# Patient Record
Sex: Male | Born: 1935 | Race: White | Hispanic: No | Marital: Married | Smoking: Former smoker
Health system: Southern US, Community
[De-identification: ages and names within clinical notes are randomized; demographics above are authoritative.]

## PROBLEM LIST (undated history)

## (undated) DIAGNOSIS — I509 Heart failure, unspecified: Secondary | ICD-10-CM

## (undated) DIAGNOSIS — I10 Essential (primary) hypertension: Secondary | ICD-10-CM

## (undated) DIAGNOSIS — I4891 Unspecified atrial fibrillation: Secondary | ICD-10-CM

## (undated) DIAGNOSIS — E785 Hyperlipidemia, unspecified: Secondary | ICD-10-CM

## (undated) DIAGNOSIS — F0280 Dementia in other diseases classified elsewhere without behavioral disturbance: Secondary | ICD-10-CM

## (undated) DIAGNOSIS — F319 Bipolar disorder, unspecified: Secondary | ICD-10-CM

## (undated) DIAGNOSIS — I739 Peripheral vascular disease, unspecified: Secondary | ICD-10-CM

## (undated) DIAGNOSIS — L97909 Non-pressure chronic ulcer of unspecified part of unspecified lower leg with unspecified severity: Secondary | ICD-10-CM

## (undated) DIAGNOSIS — I82409 Acute embolism and thrombosis of unspecified deep veins of unspecified lower extremity: Secondary | ICD-10-CM

## (undated) DIAGNOSIS — G309 Alzheimer's disease, unspecified: Secondary | ICD-10-CM

## (undated) DIAGNOSIS — I83009 Varicose veins of unspecified lower extremity with ulcer of unspecified site: Secondary | ICD-10-CM

## (undated) DIAGNOSIS — M1990 Unspecified osteoarthritis, unspecified site: Secondary | ICD-10-CM

## (undated) DIAGNOSIS — I639 Cerebral infarction, unspecified: Secondary | ICD-10-CM

---

## 1999-01-22 ENCOUNTER — Encounter (INDEPENDENT_AMBULATORY_CARE_PROVIDER_SITE_OTHER): Payer: Self-pay

## 1999-01-22 ENCOUNTER — Other Ambulatory Visit: Admission: RE | Admit: 1999-01-22 | Discharge: 1999-01-22 | Payer: Self-pay | Admitting: Gastroenterology

## 2002-02-18 DIAGNOSIS — I639 Cerebral infarction, unspecified: Secondary | ICD-10-CM

## 2002-02-18 HISTORY — DX: Cerebral infarction, unspecified: I63.9

## 2002-08-11 ENCOUNTER — Inpatient Hospital Stay (HOSPITAL_COMMUNITY): Admission: EM | Admit: 2002-08-11 | Discharge: 2002-08-16 | Payer: Self-pay | Admitting: Emergency Medicine

## 2002-08-11 ENCOUNTER — Encounter: Payer: Self-pay | Admitting: Emergency Medicine

## 2002-08-11 ENCOUNTER — Encounter: Payer: Self-pay | Admitting: Neurology

## 2002-08-12 ENCOUNTER — Encounter: Payer: Self-pay | Admitting: Cardiovascular Disease

## 2002-08-12 ENCOUNTER — Encounter: Payer: Self-pay | Admitting: Neurology

## 2003-05-31 ENCOUNTER — Emergency Department (HOSPITAL_COMMUNITY): Admission: EM | Admit: 2003-05-31 | Discharge: 2003-05-31 | Payer: Self-pay | Admitting: Emergency Medicine

## 2003-06-13 ENCOUNTER — Ambulatory Visit (HOSPITAL_COMMUNITY): Admission: RE | Admit: 2003-06-13 | Discharge: 2003-06-13 | Payer: Self-pay | Admitting: Internal Medicine

## 2003-12-27 ENCOUNTER — Ambulatory Visit: Payer: Self-pay | Admitting: Cardiology

## 2003-12-30 ENCOUNTER — Ambulatory Visit: Payer: Self-pay | Admitting: Internal Medicine

## 2004-01-31 ENCOUNTER — Ambulatory Visit: Payer: Self-pay | Admitting: Internal Medicine

## 2004-03-06 ENCOUNTER — Ambulatory Visit: Payer: Self-pay | Admitting: Internal Medicine

## 2004-03-21 ENCOUNTER — Ambulatory Visit: Payer: Self-pay | Admitting: Internal Medicine

## 2004-04-13 ENCOUNTER — Ambulatory Visit: Payer: Self-pay | Admitting: Internal Medicine

## 2004-05-14 ENCOUNTER — Ambulatory Visit: Payer: Self-pay | Admitting: Internal Medicine

## 2004-06-13 ENCOUNTER — Ambulatory Visit: Payer: Self-pay | Admitting: Internal Medicine

## 2004-07-26 ENCOUNTER — Ambulatory Visit: Payer: Self-pay | Admitting: Internal Medicine

## 2004-08-23 ENCOUNTER — Ambulatory Visit: Payer: Self-pay | Admitting: Internal Medicine

## 2004-10-01 ENCOUNTER — Ambulatory Visit: Payer: Self-pay | Admitting: Internal Medicine

## 2004-11-05 ENCOUNTER — Ambulatory Visit: Payer: Self-pay | Admitting: Internal Medicine

## 2004-12-03 ENCOUNTER — Ambulatory Visit: Payer: Self-pay | Admitting: Internal Medicine

## 2004-12-31 ENCOUNTER — Ambulatory Visit: Payer: Self-pay | Admitting: Internal Medicine

## 2005-01-18 ENCOUNTER — Ambulatory Visit: Payer: Self-pay | Admitting: Internal Medicine

## 2005-03-05 ENCOUNTER — Ambulatory Visit: Payer: Self-pay | Admitting: Internal Medicine

## 2005-04-12 ENCOUNTER — Ambulatory Visit: Payer: Self-pay | Admitting: Internal Medicine

## 2005-05-10 ENCOUNTER — Ambulatory Visit: Payer: Self-pay | Admitting: Internal Medicine

## 2005-05-29 ENCOUNTER — Ambulatory Visit: Payer: Self-pay | Admitting: Internal Medicine

## 2005-06-27 ENCOUNTER — Ambulatory Visit: Payer: Self-pay | Admitting: Internal Medicine

## 2005-07-05 ENCOUNTER — Ambulatory Visit: Payer: Self-pay | Admitting: Internal Medicine

## 2005-08-20 ENCOUNTER — Ambulatory Visit: Payer: Self-pay | Admitting: Internal Medicine

## 2005-10-09 ENCOUNTER — Ambulatory Visit: Payer: Self-pay | Admitting: Internal Medicine

## 2005-11-11 ENCOUNTER — Ambulatory Visit: Payer: Self-pay | Admitting: Internal Medicine

## 2005-11-12 ENCOUNTER — Ambulatory Visit: Payer: Self-pay | Admitting: Internal Medicine

## 2005-12-11 ENCOUNTER — Ambulatory Visit: Payer: Self-pay | Admitting: Internal Medicine

## 2005-12-11 LAB — CONVERTED CEMR LAB
INR: 6 (ref 0.9–2.0)
Prothrombin Time: 32.1 s (ref 10.0–14.0)

## 2006-01-14 ENCOUNTER — Ambulatory Visit: Payer: Self-pay | Admitting: Internal Medicine

## 2006-02-19 ENCOUNTER — Ambulatory Visit: Payer: Self-pay | Admitting: Internal Medicine

## 2006-03-18 ENCOUNTER — Ambulatory Visit: Payer: Self-pay | Admitting: Internal Medicine

## 2006-03-29 ENCOUNTER — Encounter: Payer: Self-pay | Admitting: Internal Medicine

## 2006-04-15 ENCOUNTER — Ambulatory Visit: Payer: Self-pay | Admitting: Internal Medicine

## 2006-04-15 LAB — CONVERTED CEMR LAB
ALT: 23 units/L (ref 0–40)
AST: 21 units/L (ref 0–37)
BUN: 15 mg/dL (ref 6–23)
Basophils Absolute: 0 10*3/uL (ref 0.0–0.1)
Basophils Relative: 0.3 % (ref 0.0–1.0)
CO2: 35 meq/L — ABNORMAL HIGH (ref 19–32)
Calcium: 9.2 mg/dL (ref 8.4–10.5)
Chloride: 94 meq/L — ABNORMAL LOW (ref 96–112)
Creatinine, Ser: 1.1 mg/dL (ref 0.4–1.5)
Eosinophils Absolute: 0.3 10*3/uL (ref 0.0–0.6)
Eosinophils Relative: 4 % (ref 0.0–5.0)
GFR calc Af Amer: 85 mL/min
GFR calc non Af Amer: 70 mL/min
Glucose, Bld: 153 mg/dL — ABNORMAL HIGH (ref 70–99)
HCT: 50.5 % (ref 39.0–52.0)
Hemoglobin: 17.2 g/dL — ABNORMAL HIGH (ref 13.0–17.0)
Hgb A1c MFr Bld: 8.2 % — ABNORMAL HIGH (ref 4.6–6.0)
INR: 2.2 — ABNORMAL HIGH (ref 0.9–2.0)
Lymphocytes Relative: 31 % (ref 12.0–46.0)
MCHC: 34 g/dL (ref 30.0–36.0)
MCV: 90.6 fL (ref 78.0–100.0)
Monocytes Absolute: 0.7 10*3/uL (ref 0.2–0.7)
Monocytes Relative: 8.1 % (ref 3.0–11.0)
Neutro Abs: 4.8 10*3/uL (ref 1.4–7.7)
Neutrophils Relative %: 56.6 % (ref 43.0–77.0)
Platelets: 188 10*3/uL (ref 150–400)
Potassium: 4 meq/L (ref 3.5–5.1)
Prothrombin Time: 18.8 s — ABNORMAL HIGH (ref 10.0–14.0)
RBC: 5.57 M/uL (ref 4.22–5.81)
RDW: 13.9 % (ref 11.5–14.6)
Sodium: 138 meq/L (ref 135–145)
TSH: 4.79 microintl units/mL (ref 0.35–5.50)
WBC: 8.4 10*3/uL (ref 4.5–10.5)

## 2006-04-18 ENCOUNTER — Ambulatory Visit: Payer: Self-pay | Admitting: Vascular Surgery

## 2006-06-02 ENCOUNTER — Emergency Department (HOSPITAL_COMMUNITY): Admission: EM | Admit: 2006-06-02 | Discharge: 2006-06-02 | Payer: Self-pay | Admitting: Emergency Medicine

## 2006-06-05 ENCOUNTER — Ambulatory Visit: Payer: Self-pay | Admitting: Internal Medicine

## 2006-07-21 ENCOUNTER — Ambulatory Visit: Payer: Self-pay | Admitting: Internal Medicine

## 2006-07-21 LAB — CONVERTED CEMR LAB
BUN: 11 mg/dL (ref 6–23)
Basophils Absolute: 0 10*3/uL (ref 0.0–0.1)
Basophils Relative: 0.4 % (ref 0.0–1.0)
CO2: 32 meq/L (ref 19–32)
Calcium: 9.6 mg/dL (ref 8.4–10.5)
Chloride: 101 meq/L (ref 96–112)
Cholesterol: 151 mg/dL (ref 0–200)
Creatinine, Ser: 0.9 mg/dL (ref 0.4–1.5)
Eosinophils Absolute: 0.4 10*3/uL (ref 0.0–0.6)
Eosinophils Relative: 4.9 % (ref 0.0–5.0)
GFR calc Af Amer: 107 mL/min
GFR calc non Af Amer: 88 mL/min
Glucose, Bld: 145 mg/dL — ABNORMAL HIGH (ref 70–99)
HCT: 45.3 % (ref 39.0–52.0)
HDL: 34.2 mg/dL — ABNORMAL LOW (ref >39.0)
Hemoglobin: 15.3 g/dL (ref 13.0–17.0)
Hgb A1c MFr Bld: 7.3 % — ABNORMAL HIGH (ref 4.6–6.0)
INR: 2 (ref 0.9–2.0)
LDL Cholesterol: 84 mg/dL (ref 0–99)
Lymphocytes Relative: 31.7 % (ref 12.0–46.0)
MCHC: 33.8 g/dL (ref 30.0–36.0)
MCV: 93.3 fL (ref 78.0–100.0)
Monocytes Absolute: 0.6 10*3/uL (ref 0.2–0.7)
Monocytes Relative: 7.9 % (ref 3.0–11.0)
Neutro Abs: 4.2 10*3/uL (ref 1.4–7.7)
Neutrophils Relative %: 55.1 % (ref 43.0–77.0)
Platelets: 221 10*3/uL (ref 150–400)
Potassium: 4.5 meq/L (ref 3.5–5.1)
Prothrombin Time: 17.5 s — ABNORMAL HIGH (ref 10.0–14.0)
RBC: 4.85 M/uL (ref 4.22–5.81)
RDW: 14.5 % (ref 11.5–14.6)
Sodium: 141 meq/L (ref 135–145)
Total CHOL/HDL Ratio: 4.4
Triglycerides: 164 mg/dL — ABNORMAL HIGH (ref 0–149)
VLDL: 33 mg/dL (ref 0–40)
WBC: 7.6 10*3/uL (ref 4.5–10.5)
aPTT: 27.8 s (ref 26.5–36.5)

## 2006-09-25 ENCOUNTER — Ambulatory Visit: Payer: Self-pay | Admitting: Cardiology

## 2006-09-26 ENCOUNTER — Encounter (INDEPENDENT_AMBULATORY_CARE_PROVIDER_SITE_OTHER): Payer: Self-pay | Admitting: Emergency Medicine

## 2006-09-26 ENCOUNTER — Inpatient Hospital Stay (HOSPITAL_COMMUNITY): Admission: EM | Admit: 2006-09-26 | Discharge: 2006-10-02 | Payer: Self-pay | Admitting: Emergency Medicine

## 2006-09-26 ENCOUNTER — Ambulatory Visit: Payer: Self-pay | Admitting: Vascular Surgery

## 2006-09-30 ENCOUNTER — Ambulatory Visit: Payer: Self-pay | Admitting: Physical Medicine & Rehabilitation

## 2006-11-10 ENCOUNTER — Inpatient Hospital Stay (HOSPITAL_COMMUNITY): Admission: EM | Admit: 2006-11-10 | Discharge: 2006-11-18 | Payer: Self-pay | Admitting: Emergency Medicine

## 2006-11-11 ENCOUNTER — Encounter (INDEPENDENT_AMBULATORY_CARE_PROVIDER_SITE_OTHER): Payer: Self-pay | Admitting: Internal Medicine

## 2006-11-27 ENCOUNTER — Encounter: Payer: Self-pay | Admitting: Internal Medicine

## 2006-12-02 ENCOUNTER — Observation Stay (HOSPITAL_COMMUNITY): Admission: EM | Admit: 2006-12-02 | Discharge: 2006-12-09 | Payer: Self-pay | Admitting: Emergency Medicine

## 2006-12-05 ENCOUNTER — Ambulatory Visit: Payer: Self-pay | Admitting: Internal Medicine

## 2006-12-07 ENCOUNTER — Ambulatory Visit: Payer: Self-pay | Admitting: Internal Medicine

## 2006-12-17 ENCOUNTER — Ambulatory Visit: Payer: Self-pay | Admitting: Cardiology

## 2006-12-17 LAB — CONVERTED CEMR LAB
CO2: 33 meq/L — ABNORMAL HIGH (ref 19–32)
Calcium: 8.9 mg/dL (ref 8.4–10.5)
Chloride: 100 meq/L (ref 96–112)
Creatinine, Ser: 1.1 mg/dL (ref 0.4–1.5)
GFR calc non Af Amer: 70 mL/min
Glucose, Bld: 138 mg/dL — ABNORMAL HIGH (ref 70–99)
Potassium: 4.4 meq/L (ref 3.5–5.1)
Sodium: 140 meq/L (ref 135–145)

## 2007-02-17 ENCOUNTER — Inpatient Hospital Stay (HOSPITAL_COMMUNITY): Admission: EM | Admit: 2007-02-17 | Discharge: 2007-02-20 | Payer: Self-pay | Admitting: Emergency Medicine

## 2007-03-13 ENCOUNTER — Encounter: Payer: Self-pay | Admitting: Pulmonary Disease

## 2007-03-13 ENCOUNTER — Ambulatory Visit: Payer: Self-pay | Admitting: Internal Medicine

## 2007-03-17 ENCOUNTER — Emergency Department (HOSPITAL_COMMUNITY): Admission: EM | Admit: 2007-03-17 | Discharge: 2007-03-17 | Payer: Self-pay | Admitting: Emergency Medicine

## 2007-03-27 ENCOUNTER — Ambulatory Visit: Payer: Self-pay | Admitting: Pulmonary Disease

## 2007-03-27 DIAGNOSIS — I10 Essential (primary) hypertension: Secondary | ICD-10-CM | POA: Insufficient documentation

## 2007-03-27 DIAGNOSIS — G4733 Obstructive sleep apnea (adult) (pediatric): Secondary | ICD-10-CM | POA: Insufficient documentation

## 2007-03-27 DIAGNOSIS — E119 Type 2 diabetes mellitus without complications: Secondary | ICD-10-CM | POA: Insufficient documentation

## 2007-03-31 ENCOUNTER — Encounter: Payer: Self-pay | Admitting: Pulmonary Disease

## 2007-03-31 ENCOUNTER — Ambulatory Visit (HOSPITAL_BASED_OUTPATIENT_CLINIC_OR_DEPARTMENT_OTHER): Admission: RE | Admit: 2007-03-31 | Discharge: 2007-03-31 | Payer: Self-pay | Admitting: Pulmonary Disease

## 2007-04-15 ENCOUNTER — Ambulatory Visit: Payer: Self-pay | Admitting: Pulmonary Disease

## 2007-04-17 ENCOUNTER — Telehealth (INDEPENDENT_AMBULATORY_CARE_PROVIDER_SITE_OTHER): Payer: Self-pay | Admitting: *Deleted

## 2007-05-01 ENCOUNTER — Ambulatory Visit: Payer: Self-pay | Admitting: Pulmonary Disease

## 2007-05-26 ENCOUNTER — Encounter: Payer: Self-pay | Admitting: Pulmonary Disease

## 2007-06-01 ENCOUNTER — Encounter: Payer: Self-pay | Admitting: Pulmonary Disease

## 2008-06-02 IMAGING — CR DG CHEST 1V PORT
1 series · 1 of 1 positions shown · non-contrast
Comparison: none

CLINICAL DATA: 73-year-old male with respiratory distress.  Shortness of breath.  
 PORTABLE CHEST - 11/10/2006:

[view not recorded]
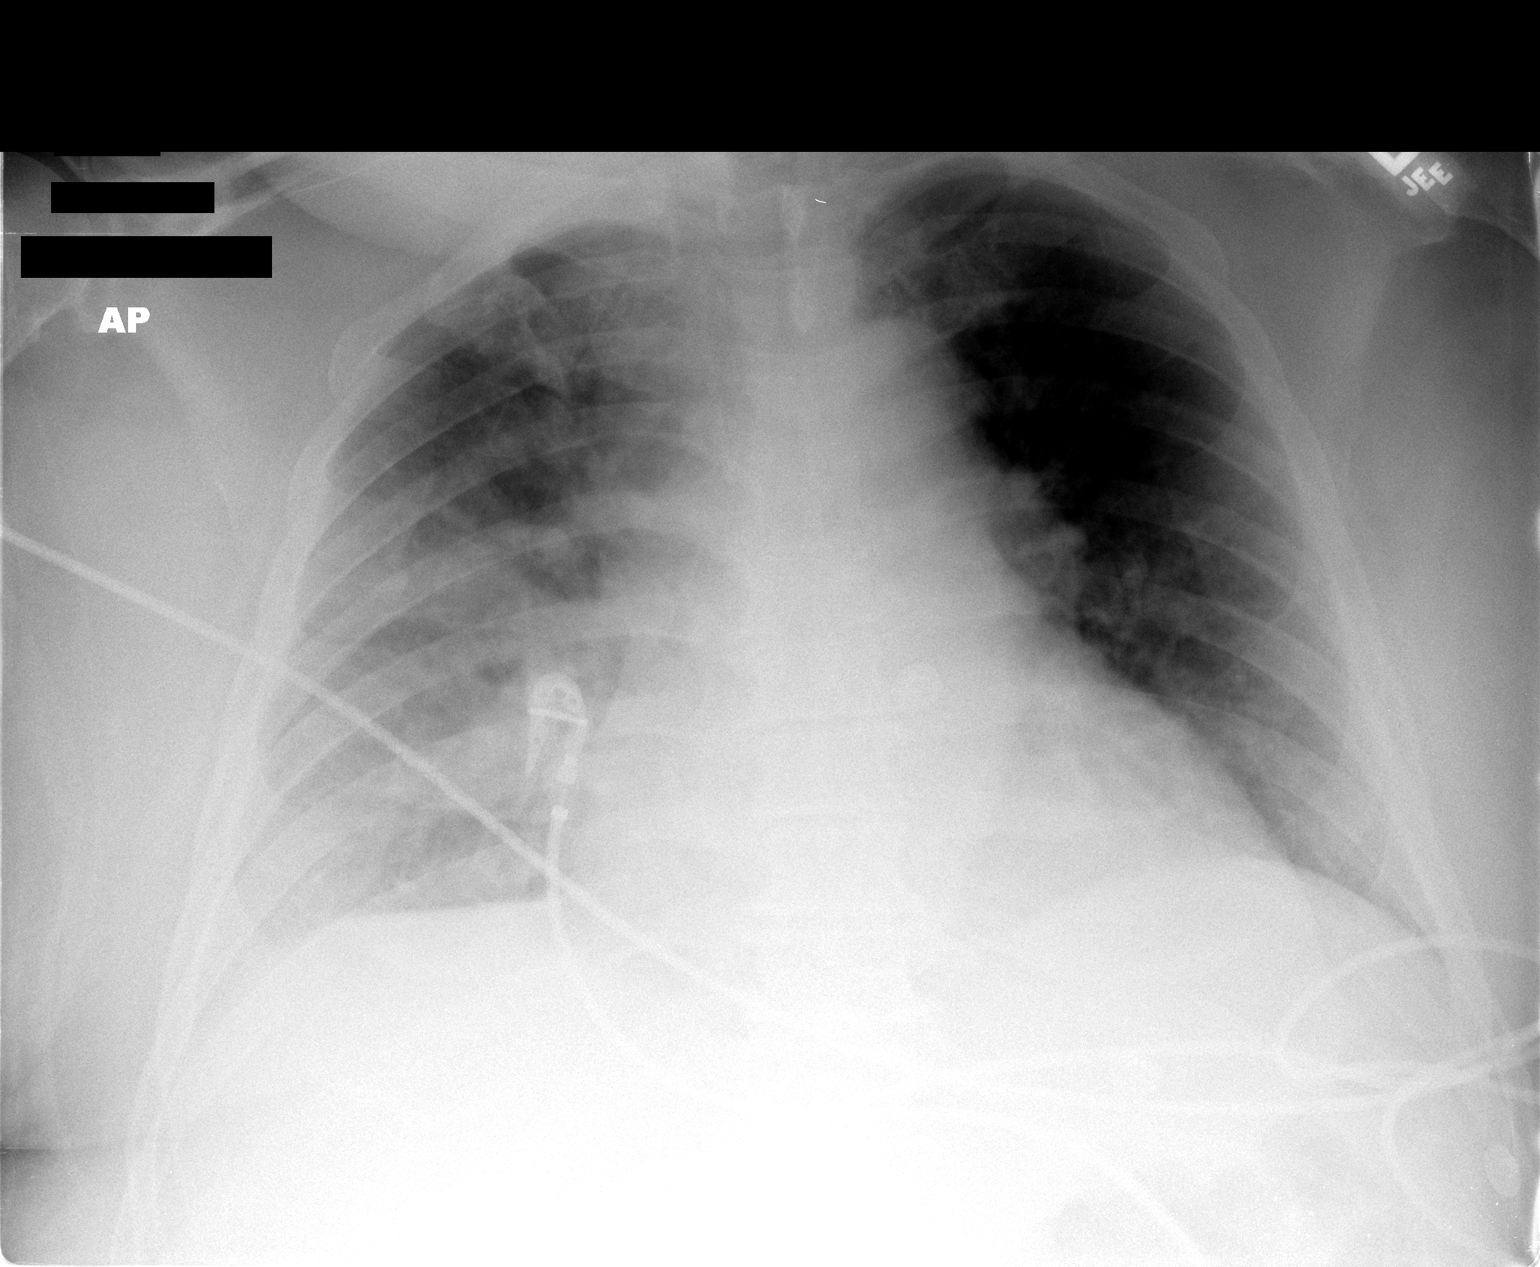

[1 of 1 positions shown; findings below may reference images not displayed]

FINDINGS: Cardiopericardial silhouette is mildly enlarged.  Lung volumes are decreased.  Airspace disease is asymmetric on the right.  Mild edema is now present.
IMPRESSION: Cardiomegaly and asymmetric airspace disease.  This may represent asymmetric edema and failure versus early infection.

## 2008-06-03 IMAGING — CR DG CHEST 1V PORT
1 series · 1 of 1 positions shown · non-contrast
Comparison: 11/10/06.

CLINICAL DATA: Pulmonary edema.  Hypertensive crisis. Ventricular tachycardia.
 PORTABLE CHEST - 1 VIEW ? 11/11/06 AT 3433 HOURS:

[view not recorded]
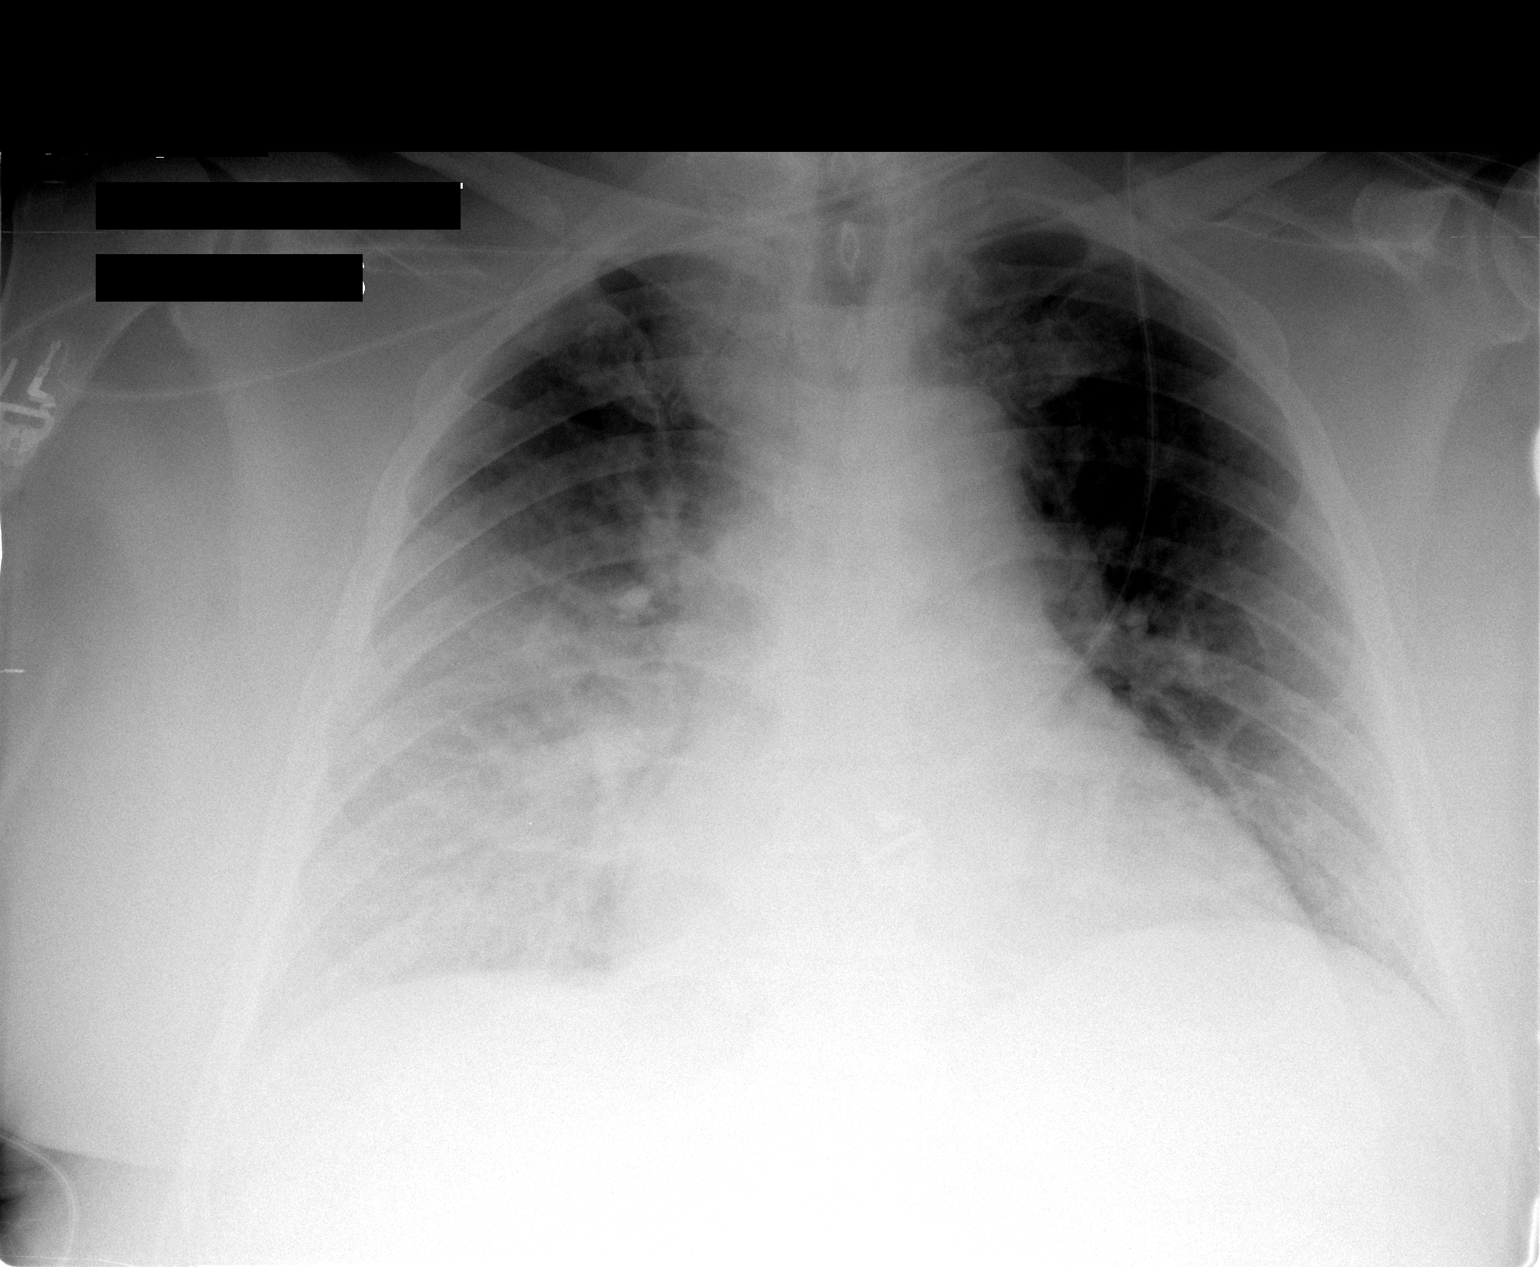

[1 of 1 positions shown; findings below may reference images not displayed]

FINDINGS: There is cardiomegaly. Again seen is asymmetrical airspace disease involving the right lung to a greater extent. This may be secondary to pneumonia or asymmetrical pulmonary edema. The aeration of the lungs is not significantly changed.
IMPRESSION: Cardiomegaly with asymmetrical airspace disease (right greater than left). No significant change in aeration.

## 2008-06-24 IMAGING — CR DG CHEST 2V
1 series · 1 of 1 positions shown · non-contrast
Comparison: 11/16/2006

CLINICAL DATA: Patient is unable to stand.  CHF.
 CHEST - 2 VIEW:

[view not recorded]
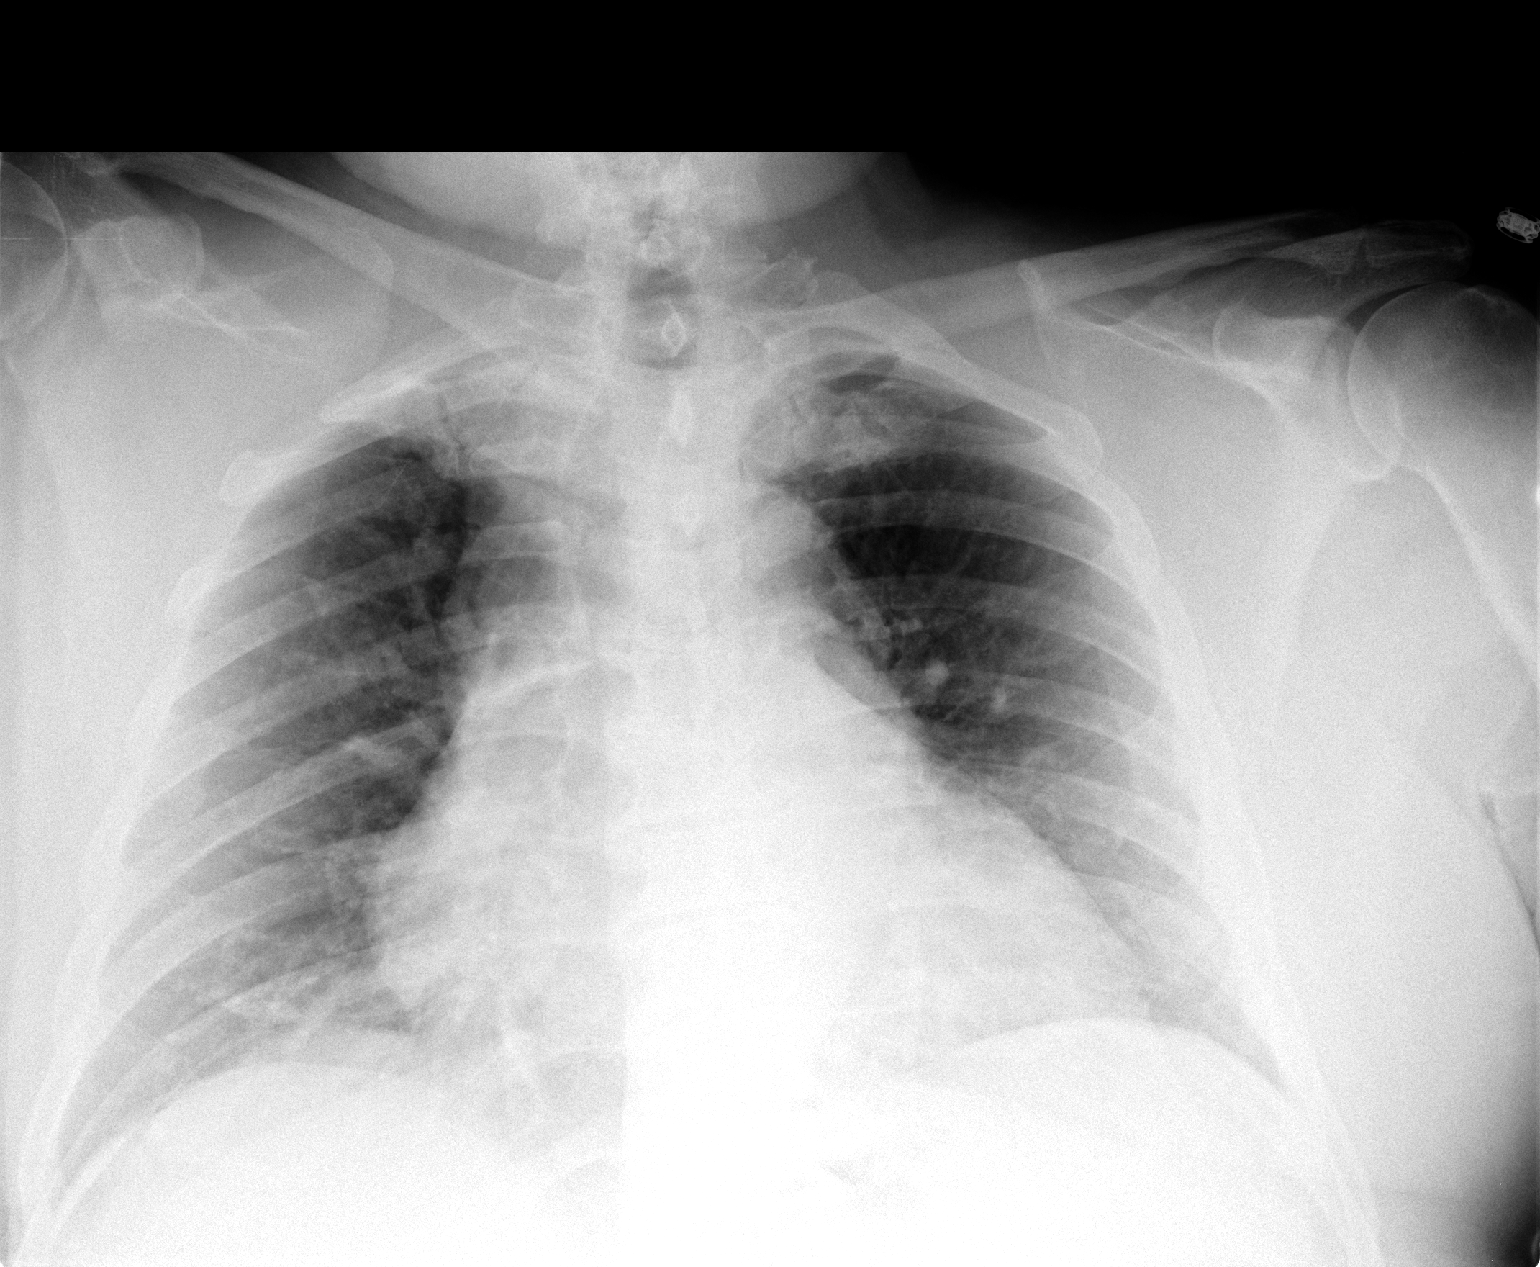

[1 of 1 positions shown; findings below may reference images not displayed]

FINDINGS: The heart is moderately enlarged.  Pulmonary vascularity is within normal limits.  Lungs are clear and under inflated.   No pneumothoraces or effusions are seen.
IMPRESSION: Cardiomegaly without CHF.

## 2008-06-24 IMAGING — CT CT HEAD W/O CM
1 series · 15 of 30 positions shown, 19 images · IV contrast (agent unspecified)
Comparison: MRI brain 06/13/03

CLINICAL DATA: 74-year-old male with weakness, headache, and history of stroke, hypertension and diabetes.  
 HEAD CT WITHOUT CONTRAST:
TECHNIQUE: Contiguous axial images were obtained from the base of the skull through the vertex according to standard protocol without contrast.

[Series 2: head routine 4.8 h47s · axial · 0.46mm/px · z∈[-125,+5]mm · 15 of 30 slices shown, 19 images]
[im 2/30  brain]
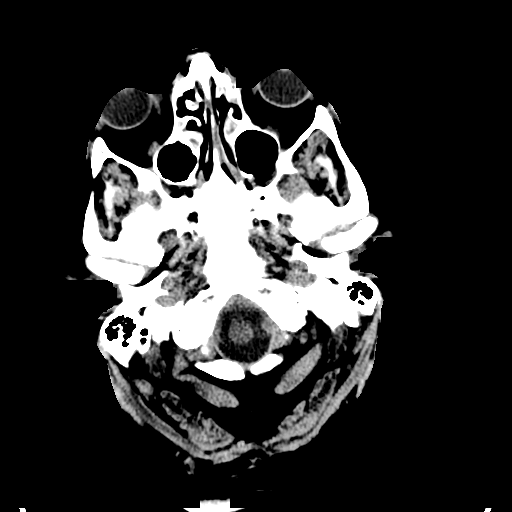
[im 2/30  bone]
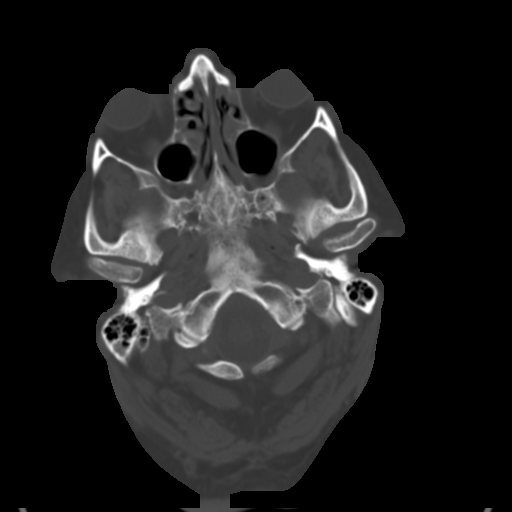
[im 4/30  brain]
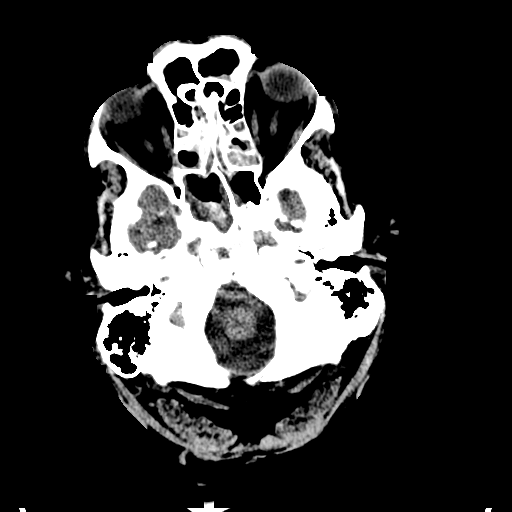
[im 6/30  brain]
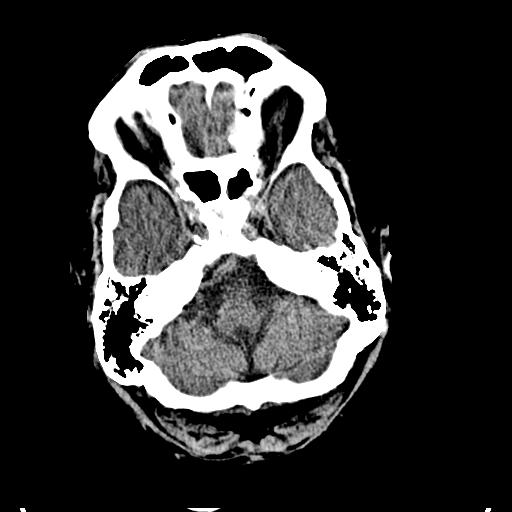
[im 8/30  brain]
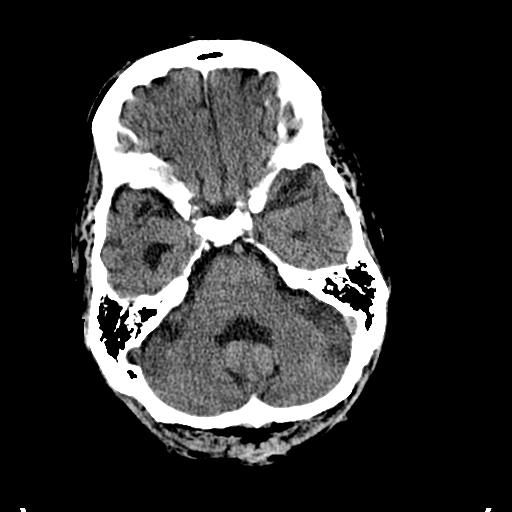
[im 10/30  brain]
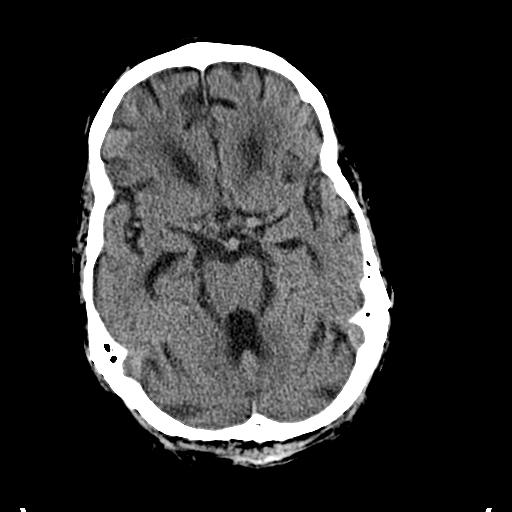
[im 10/30  bone]
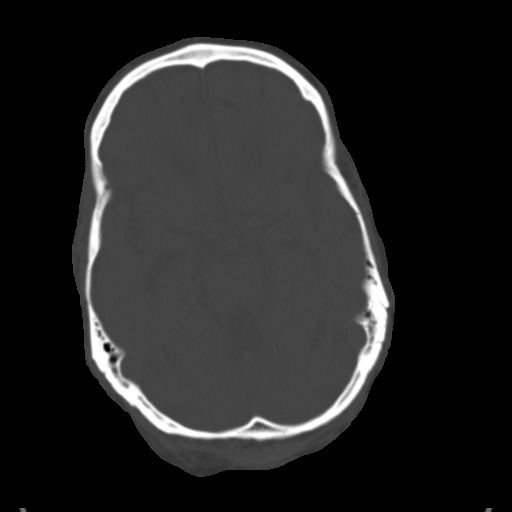
[im 12/30  brain]
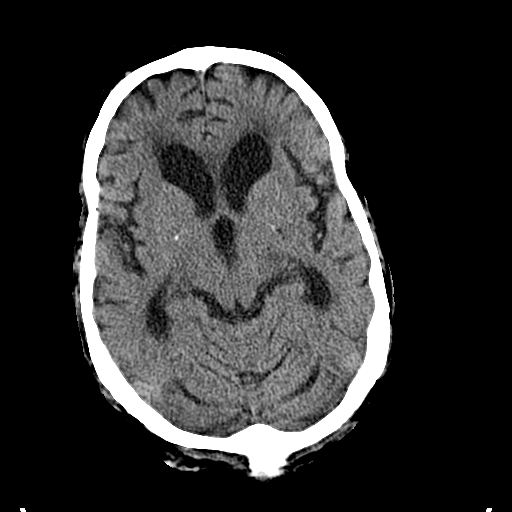
[im 14/30  brain]
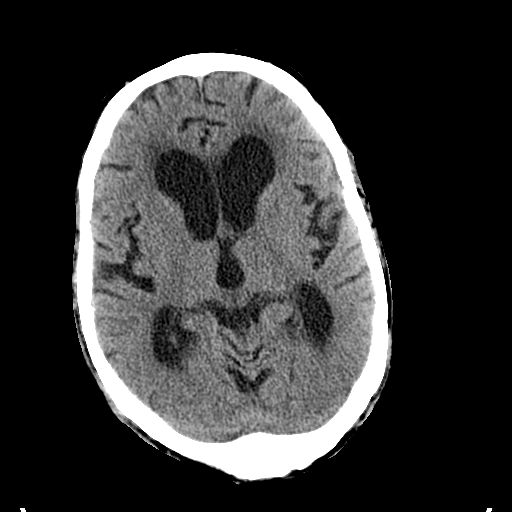
[im 16/30  brain]
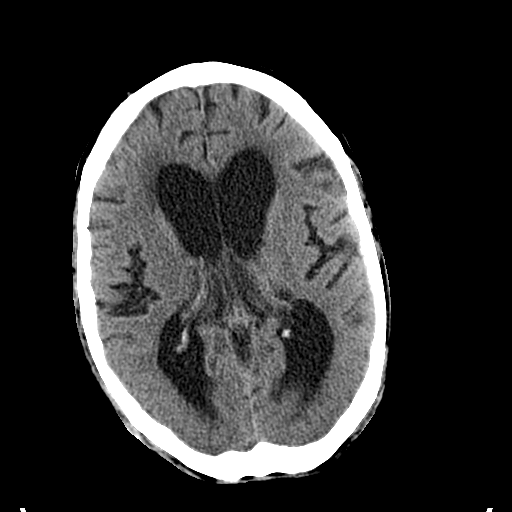
[im 17/30  brain]
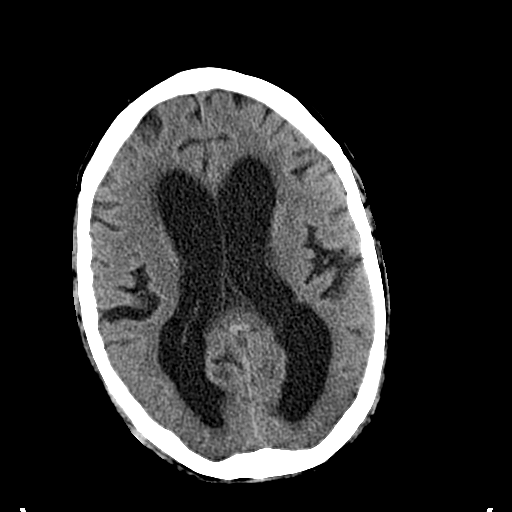
[im 17/30  bone]
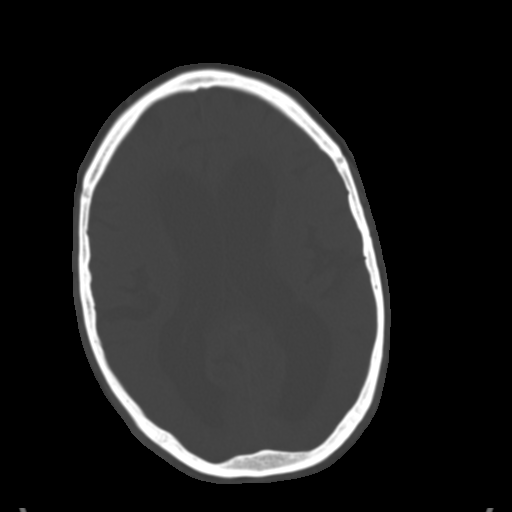
[im 19/30  brain]
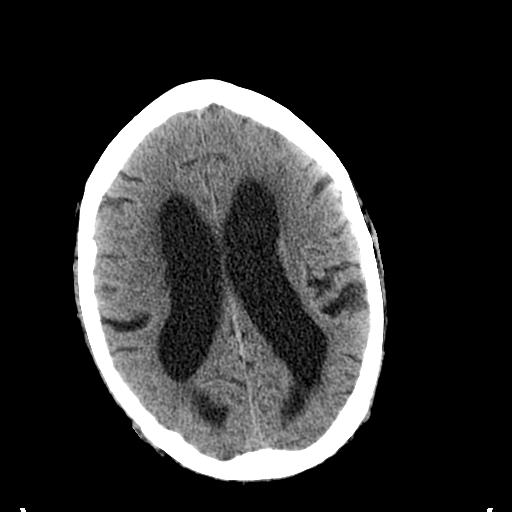
[im 21/30  brain]
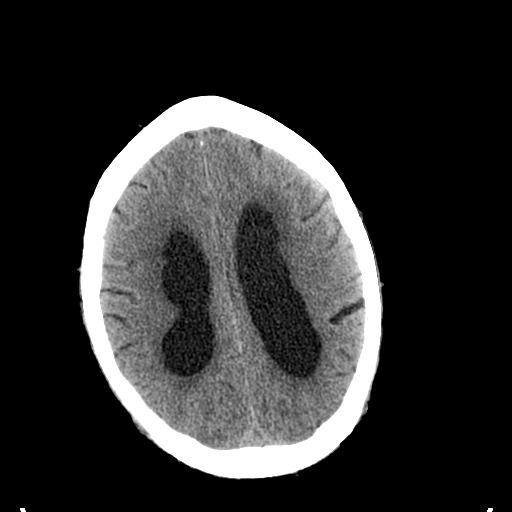
[im 23/30  brain]
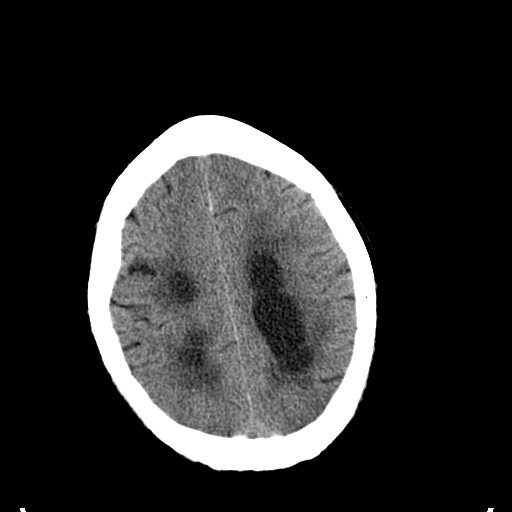
[im 25/30  brain]
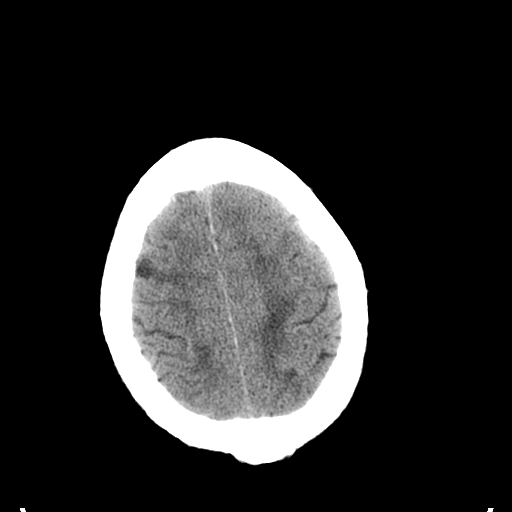
[im 25/30  bone]
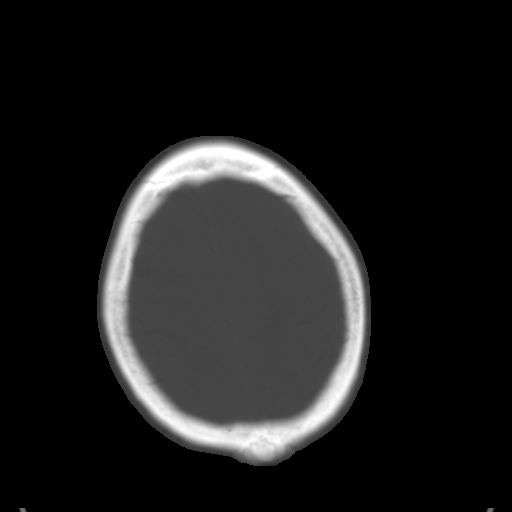
[im 27/30  brain]
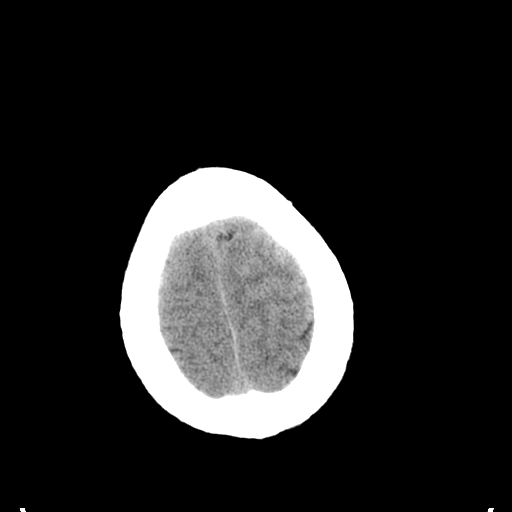
[im 29/30  brain]
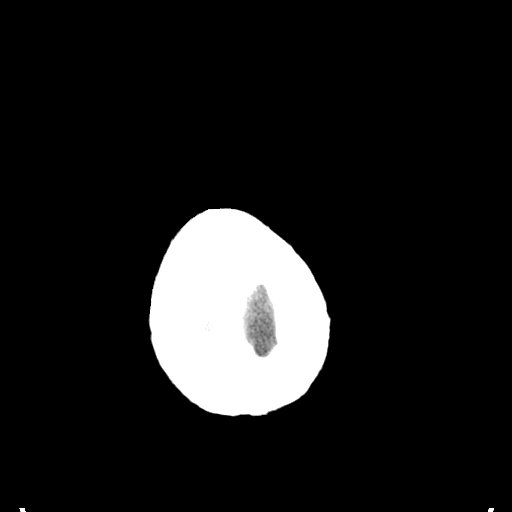

[15 of 30 positions shown; findings below may reference images not displayed]

FINDINGS: There is ventriculomegaly which is diffuse and stable from the prior exam.  Given the appearance, normal pressure hydrocephalus cannot be excluded.  There is no midline shift, mass effect, or intracranial hemorrhage identified.  Small basal ganglia dystrophic calcifications are noted.  No evidence of acute cortically based infarct.  Right frontal lobe superior encephalomalacia is stable compatible with prior infarct.  The visualized orbits are normal.  Scalp soft tissues are within normal limits.  There is new opacification of the ethmoid and sphenoid sinuses bilaterally.  The frontal sinuses and visualized maxillary sinuses are spared.  Mastoid air cells remain clear.  Atherosclerotic calcification of the right carotid siphon is noted.  Incidental nonfusion of the posterior C1 arch is seen.  No acute osseous abnormality is seen.
IMPRESSION: 1.  Stable appearance of the brain, no acute intracranial abnormality.
 2.  Diffuse ventriculomegaly is stable, but given the appearance, clinical correlation for the possibility of normal pressure hydrocephalus is recommended.
 3.  Ethmoid and sphenoid sinus disease is new.

## 2010-03-22 NOTE — Consult Note (Signed)
Summary: [facility]  [facility]   Imported By: Sherian Rein 08/07/2009 15:17:25  _____________________________________________________________________  External Attachment:    Type:   Image     Comment:   External Document

## 2010-07-03 NOTE — Discharge Summary (Signed)
NAMELAWTON, DOLLINGER               ACCOUNT NO.:  [id number]   MEDICAL RECORD NO.:  [id number]          PATIENT TYPE:  INP   LOCATION:  4708                         FACILITY:  [facility]   PHYSICIAN:  Mobolaji B. Bakare, M.D.DATE OF BIRTH:  1932/05/31   DATE OF ADMISSION:  09/25/2006  DATE OF DISCHARGE:  10/02/2006                               DISCHARGE SUMMARY   ADDENDUM:  This is an addendum to an earlier discharge summary done by  Dr. Oneita Hurt.   PRIMARY CARE PHYSICIAN:  Dr. Redmond School   The patient was scheduled for discharge on September 30, 2006, but family  requested short-term nursing facility placement because they could not  provide care at home, although home health services were arranged at  that time.  The patient is now accepted for short-term rehabilitation at  [facility] [place].   FINAL DISCHARGE MEDICATIONS:  1. Fosinopril 20 mg daily.  2. Lasix 40 mg daily.  3. Crestor 10 mg 1/2 tab to be taken on Monday, Wednesday, and Friday.  4. Coumadin 7.5 mg daily.  Further adjustments to be made to Coumadin      dosing per INR.  5. Depakote ER 500 mg daily.  6. Lantus 20 units subcu q.h.s.  7. Glipizide 5 mg q.h.s.  8. Citalopram 20 mg q.h.s.  9. Xanax 0.5 mg q.4-6h. p.r.n. anxiety.  10.Hydrocodone 5 mg t.i.d. p.r.n. pain.   The following medications have been discontinued:  1. Metformin 1000 mg b.i.d.  2. Toprol XL 100 mg daily.  3. Taztia XT 180 mg daily.   WOUND CARE:  1. Apply Mepilex border to right lower extremity and around posterior      wound.  Change every Monday and Friday.  2. Right toe, Mepilex light, apply to right lower extremity and change      on Monday, Wednesday, and Friday.   DISPOSITION:  To skilled nursing facility for short-term stay and  rehabilitation.   HOSPITAL COURSE:  This is an addendum to previous hospital course as  dictated by Dr. Oneita Hurt.  1. Diabetes mellitus.  The patient's hemoglobin A1c was 8.2 on      arrival.  He  was started on low dose Lantus.  Metformin was held.      Glipizide was discontinued.  Despite reducing the home dose of      hypoglycemic agents, his blood glucose ranged between 160-220.      Hence, he will be discharged home on Lantus 20 units subcu daily,      glipizide; metformin is on hold.  The patient's daughter stated      that he is not compliant with the metformin in the morning, but she      gives him all his other medications at night.  His compliance to      medication, again, is questionable.  2. Hypertension.  The patient is noted to have been on 3 blood      pressure medications, i.e., Taztia XT 180 mg daily, Toprol XL 100      mg daily, fosinopril 20 mg daily.  While in the  hospital, his blood      pressure has been well-controlled on fosinopril and Toprol XL.  In      fact, the blood pressure is trending downwards.  At the time of      dictation, blood pressure is 95/54.  Therefore, Toprol XL was also      discontinued.  Further adjustments can be made to blood pressure      medications if blood pressure starts to increase.   CONDITION ON DISCHARGE:  The patient is hemodynamically stable.  Temperature 98, pulse 97, respiratory rate 20, blood pressure 195/54, O2  saturations 95% on room air.  The patient needs to have physical therapy  and occupational therapy in the rehabilitation center in the skilled  nursing facility.   DISCHARGE LABORATORY DATA:  PT 17.4, INR 1.4.   RECOMMENDATIONS:  Check PT/INR in 2-3 days.  Further recommendations for  Coumadin per attending physician at the facility.      Mobolaji B. Corky Downs, M.D.  Electronically Signed     MBB/MEDQ  D:  10/02/2006  T:  10/02/2006  Job:  [id number]   cc:   Dr. Redmond School

## 2010-07-03 NOTE — Discharge Summary (Signed)
[name], [name] NO.:  [id number]   MEDICAL RECORD NO.:  [id number]          PATIENT TYPE:  INP   LOCATION:  5729                         FACILITY:  [facility]   PHYSICIAN:  Isidor Holts, M.D.  DATE OF BIRTH:  Jun 19, 1932   DATE OF ADMISSION:  12/02/2006  DATE OF DISCHARGE:  12/09/2006                               DISCHARGE SUMMARY   ADDENDUM:   PRIMARY CARE PHYSICIAN:  Dr. Livingston Diones at [facility].   For discharge diagnoses, procedures, consultations, and detailed  clinical course, referred to interim discharge summary dated December 08, 2006.  The patient has been seen by urologist on December 08, 2006, i.e.,  Dr. Logan Bores, and opinion is that the patient's prepuce retracts quite  easily without any difficulty and the meatus is normal.  Therefore the  patient has no need for circumcision or dorsal slit.  No further  treatment is recommended.  In addition, the patient was reevaluated by  cardiologist on December 09, 2006.  His Coreg dose has been further  reduced to 6.25 mg p.o. b.i.d. and recommendation is that the patient  will need close follow-up of basic metabolic panel on an outpatient  basis. He has been recommended to avoid ACE inhibitors.  No diuretics  were administered on December 09, 2006.  Diuretics may be resumed in low-  dose, i.e., 40 mg p.o. daily from December 10, 2006.  The patient is  recommended to follow up with Dr. Dietrich Pates, cardiologist, following  discharge.  In addition, it is pertinent to note the creatinine on  December 09, 2006, was 1.62.  As there were no further active issues, the  patient has been discharged as planned on December 09, 2006.      Isidor Holts, M.D.  Electronically Signed     CO/MEDQ  D:  12/09/2006  T:  12/10/2006  Job:  [id number]   cc:   [facility] Dr. Westley Chandler, MD, [facility]  Jonelle Sidle, MD

## 2010-07-03 NOTE — Procedures (Signed)
[name], [name] NO.:  [id number]   MEDICAL RECORD NO.:  [id number]          PATIENT TYPE:  OUT   LOCATION:  SLEEP CENTER                 FACILITY:  [facility]   PHYSICIAN:  Barbaraann Share, MD,FCCPDATE OF BIRTH:  11[birth year]02-[birth year]   DATE OF STUDY:  03/31/2007                            NOCTURNAL POLYSOMNOGRAM   REFERRING PHYSICIAN:  Barbaraann Share, MD,FCCP   REFERRING PHYSICIAN:  Barbaraann Share, MD,FCCP   INDICATION FOR THE STUDY:  Hypersomnia with sleep apnea.   EPWORTH SCORE:  5   SLEEP ARCHITECTURE:  The patient had a total sleep time of 364 minutes  with no slow-wave sleep and only 76 minutes of REM.  Sleep onset latency  was normal at 22 minutes, and REM onset was prolonged at 188 minutes.  Sleep efficiency was decreased at 85%.   RESPIRATORY DATA:  The patient was found to have 38 obstructive apneas,  42 central apneas, and 148 obstructive hypopneas for an apnea-hypopnea  index of 38 events per hour.  The events were not positional, and there  was loud snoring noted throughout.   OXYGEN DATA:  There was severe O2 desaturation as low as 30% during the  night, however, a question was raised whether or not the probe was  functioning properly or in the proper position.  Overall, the patient  spent 73 minutes, less than 88% during the night.   CARDIAC DATA:  The patient was found to have atrial fibrillation with a  controlled ventricular response.   MOVEMENT/PARASOMNIA:  None.   IMPRESSION/RECOMMENDATION:  1. Severe obstructive and central sleep apnea with an apnea-hypopnea      index of 38 events per hour and O2 desaturation possibly as low as      30%.  Treatment for this degree of sleep apnea should focus on      weight loss as      well as CPAP.  2. Atrial fibrillation with controlled ventricular response noted      during this study.      Barbaraann Share, MD,FCCP  Diplomate, American Board of Sleep  Medicine  Electronically Signed     KMC/MEDQ  D:  04/15/2007 07:42:13  T:  04/15/2007 16:18:54  Job:  [id number]

## 2010-07-03 NOTE — Consult Note (Signed)
NAMEAARISH, ROCKERS NO.:  [id number]   MEDICAL RECORD NO.:  [id number]          PATIENT TYPE:  INP   LOCATION:  5729                         FACILITY:  [facility]   PHYSICIAN:  Pricilla Riffle, MD, FACCDATE OF BIRTH:  06/04/1932   DATE OF CONSULTATION:  08/05/2006  DATE OF DISCHARGE:                                 CONSULTATION   IDENTIFICATION:  The patient is a 77 year old who we are asked to see  regarding weakness, CHF.   HISTORY OF PRESENT ILLNESS:  The patient was remotely seen in Cardiology  by Lewayne Bunting back in 2004.  He has a history of CVA, chronic atrial  fibrillation, CHF (mainly right-sided).   He was admitted October 14 with weakness.  Note, he had recently been  discharged after being treated for pneumonia.  On this last discharge,  he was sent to the nursing home on 100 t.i.d. Lasix plus Zaroxolyn.  Since that time, he has been feeling poorly, week, unable to stand up.   On admission, he was found to have a UTI.  CT was negative.  He was felt  to be dehydrated and Lasix was held and he was given IV fluids.   The patient has improved clinically and now denies chest pain.  No  shortness of breath.  No dizziness.   ALLERGIES:  PENICILLIN.   CURRENT MEDICATIONS:  1. Aspirin 81.  2. Coreg 12.5 b.i.d.  3. Keflex 500 b.i.d.  4. Celexa 20 q.h.s.  5. Depakote 500.  6. Enoxaparin 40 daily.  7. Lasix 60 b.i.d. p.o.  8. Glipizide 5 b.i.d.  9. Sliding scale insulin.  10.Lisinopril 5.  11.Protonix 40.  12.Senna q.h.s.  13.Zocor 20.  14.Coumadin q.h.s.   PAST MEDICAL HISTORY:  1. Chronic atrial fibrillation.  2. History of right brain CVA 2004.  3. Hypertension.  4. Hyperlipidemia.  5. Diabetes.  6. Depression.  7. Pneumonia September 2008.  8. Obesity.  9. History of DVT.  10.Venous stasis ulcers.  11.Chronic dyspnea.  12.Chronic diastolic CHF.  13.Normal adenosine Myoview August 2004.   Note echocardiogram on September 23 showed an LVEF  of 60-70%.  The RV  was noted to be mild to moderately dilated as was the RA.  There was  moderate dilatation also of the left atrium.  Moderate TR.  No RVEF was  commented on.   SOCIAL HISTORY:  The patient lives in [facility] nursing facility.  He is  retired.  He does not smoke, does not drink.   FAMILY HISTORY:  Father died of blood clots, otherwise no known cardiac  history.   REVIEW OF SYSTEMS:  All systems reviewed negative to the above problem  except as noted above.   PHYSICAL EXAMINATION:  GENERAL:  Currently the patient denies chest  pain.  No shortness of breath.  VITAL SIGNS:  Blood pressure is 115-121/60-83, his pulse is 78 and  irregular, temperature is 98.0, O2 saturation on 2 liters 94%.  Weight  per report on admission was 104 kg, now 139 (?).  I's and O's on October  15, he was positive 192;  October 16, -2320; October 17, -1150.  HEENT:  Normocephalic, atraumatic.  EOMI.  PERRL.  Missing front four  teeth.  Mucous membranes moist.  NECK:  JVP is normal.  No bruits.  LUNGS:  Relatively clear.  No rales or wheezes.  CARDIAC:  Exam regular rate and rhythm S1-S2.  No S3, no murmurs.  ABDOMEN:  Supple, nontender.  No hepatosplenomegaly.  No masses.  Normal  bowel sounds.  EXTREMITIES:  Good distal pulses 2+.  No lower extremity edema, but  chronic skin changes with previous edema.  NEUROLOGICAL:  Alert and orient x3.  Cranial nerves II-XII grossly  intact.  Moving all extremities.  Normal strength.   STUDIES:  Chest x-ray by report done October 14 shows cardiomegaly  without CHF.  EKG shows atrial fibrillation, 84 beats per minute, right  bundle branch block.   LABORATORY DATA:  Hemoglobin 11.9, WBC of 8, BUN and creatinine of 17  and 1.05, potassium of 3.4.  UA shows moderate leukocyte esterase,  positive nitrate, many bacteria.   IMPRESSION:  The patient is a 77 year old history of atrial  fibrillation, CHF (right-sided) diastolic dysfunction, question sleep   apnea.  Admitted with failure to thrive, dehydration, UTI.  His Lasix  dose has been decreased.  Clinically he is improved.  LV is normal.  RV  is dilated.  On exam presently, volume status looks good.  No evidence  for significant volume overload.   RECOMMENDATIONS:  1. Would continue current regimen.  Make sure that he has an      outpatient followup appointment- Wednesday, 10/29 at 1:30 PM at      [facility] [address]).  2. Replete K.  3. Dyslipidemia.  Continue statin  4. Afib.  Continue rrate control and coumadin.   DICTATION ENDED HERE      Pricilla Riffle, MD, [facility]  Electronically Signed     PVR/MEDQ  D:  12/05/2006  T:  12/08/2006  Job:  [id number]

## 2010-07-03 NOTE — Consult Note (Signed)
NAMEUNIQUE, SEARFOSS NO.:  [id number]   MEDICAL RECORD NO.:  [id number]          PATIENT TYPE:  INP   LOCATION:  2906                         FACILITY:  [facility]   PHYSICIAN:  Jamison Neighbor, M.D.  DATE OF BIRTH:  10-11-1932   DATE OF CONSULTATION:  11/11/2006  DATE OF DISCHARGE:                                 CONSULTATION   CONSULTANT:  Dr. Logan Bores.   REASON FOR CONSULTATION:  Inability to place Foley catheter.   HISTORY:  This 77 year old male was admitted to the hospital service  with respiratory failure.  This was felt to be multifocal diastolic  heart failure, with possible aspiration pneumonia.  Urologic  consultation was sought to place a Foley catheter for monitoring  purposes.   I was able to obtain somewhat of a history from both the patient and his  family.  It does not appear that the patient has had any  previous  history of urologic instrumentation.  He has never required care by  urology.  He has not had any previous TURP or other prostatic urethral  or bladder surgery.  He is taking no medication for bladder outlet  symptoms.  He has, however, had difficulty with Foley catheter placement  during previous hospitalization, and it was also difficult to place the  Foley catheter in the emergency room.  This was not due so much to the  problems with the prostate as was with a very edematous foreskin and  inability to cannulate the urethra in a blind fashion.   The patient's past medical history is heart for diabetes, hypertension,  bronchitis, heart failure, a history of deep venous thrombosis, a  history of lower extremity venous stasis sores.  He has a history of a  cerebrovascular accident.  He is noted to have atrial fibrillation,  bipolar disorder, depression, peripheral vascular disease, etc.   MEDICATIONS:  At time of admission are Lasix, fosinopril, Crestor,  Lantus insulin, Celexa, Xanax, Vicodin, glipizide, Depakote, Coumadin,   clonidine.   EXAMINATION:  GENERAL:  On examination the patient is somewhat short of  breath.  HEENT:  Exam is remarkable for multiple missing teeth.  ABDOMEN:  Soft, nontender, with no pallor, masses, rebound or guarding.  The patient has marked edema in the lower portion the abdomen, as well  as across the scrotum and penis.  EXTREMITIES:  Show marked venous stasis changes.  RECTAL:  I did not do a rectal examination.   With some difficulty, the foreskin was squeezed and some of the edema  was removed, but it could never be fully reduced.  With the edema  removed however, and with significant traction, that the head of the  penis could be identified.  The meatus could be seen.  A standard 14-  French catheter would not pass through this.  A 14 C[place]ay would not pass  through this, due to what was felt to be a little bit of a stricture at  the fossa navicularis.  A 12-French catheter, however, could be passed  and without much difficulty was passed into the bladder.  There did  not  appear to be evidence of a stricture or any evidence of obstruction at  the prostate, as this small catheter easily the bladder.  Clear urine  was seen and was placed to straight drainage.   I suggested to the patient that, if he should get healthy enough to get  out of the hospital and to be significant better from a medical  standpoint, that he really might want to consider a circumcision.  At a  minimum, he ought to consider a dorsal slit that could be done under  local to allow future urologists better access to his meatus.  For now,  however, I would recommend the Foley catheter be left in place until the  patient is fully ambulatory, as removal of the catheter could be quite  difficult if there is any possibility it might need to be replaced.   I will be available should further problems arise, but will not round on  the patient unless reconsulted.      Jamison Neighbor, M.D.  Electronically  Signed     RJE/MEDQ  D:  11/11/2006  T:  11/12/2006  Job:  [id number]

## 2010-07-03 NOTE — H&P (Signed)
[name], [name] NO.:  [id number]   MEDICAL RECORD NO.:  [id number]          PATIENT TYPE:  INP   LOCATION:  1827                         FACILITY:  [facility]   PHYSICIAN:  Michaelyn Barter, M.D. DATE OF BIRTH:  09-18-1932   DATE OF ADMISSION:  02/17/2007  DATE OF DISCHARGE:                              HISTORY & PHYSICAL   PRIMARY CARE PHYSICIAN:  Dr. Samara Snide.   CHIEF COMPLAINT:  Altered mental status.   HISTORY OF PRESENT ILLNESS:  [name] is a 77 year old gentleman who  resides at [facility] nursing facility. According to the patient's  daughter, she was called this morning by one of the nurses. She was told  that the patient's heart rate was increased, that his O2 saturations  were on the low side and that although the patient has tremors at  baseline and has had them for at least a year, they appeared to be more  pronounced today. She also indicated that the patient seemed to be a  little bit more confused than at his baseline, despite the fact that the  patient does have dementia. She states that the patient typically is  much more talkative than he is today. Although he is able to respond  verbally, his responses are somewhat slower than usual. The patient is  wide awake and he indicates that he feels fine however.   PAST MEDICAL HISTORY:  1. CVA approximately 4 years ago.  2. Atrial fibrillation.  3. Diabetes mellitus.  4. Hypertension.  5. Hyperlipidemia.  6. DVT.  7. Venous stasis ulcers.  8. Dehydration secondary to over diuresis.  9. E. coli  resistant to quinolones.  10.Failure to thrive.  11.Questionable congestive heart failure. The patient however had a 2-      D echo completed November 11, 2006 which revealed the overall left      ventricular systolic function to be normal. Left ventricular      ejection fraction was estimated to range between 60-70%.  12.Weakness.  13.Depression.  14.Bronchitis.  15.Metabolic  syndrome.   ALLERGIES:  PENICILLIN.   CURRENT MEDICATIONS:  1. Cymbalta 60 mg 1 capsule daily.  2. Baby aspirin 81 mg p.o. daily.  3. Prilosec OTC 20 mg 1 tablet daily.  4. Ultram 50 mg 1 tablet q.6 h.  5. Depakote ER 500 mg 1 tablet daily.  6. Glipizide 5 mg 1 tablet p.o. b.i.d.  7. Coreg 6.25 mg 1 tablet p.o. b.i.d.  8. Lantus insulin 43 units subcu q.h.s. at bedtime.   SOCIAL HISTORY:  Cigarettes and alcohol have been denied in the past.   FAMILY HISTORY:  Currently unknown.   REVIEW OF SYSTEMS:  As per HPI.   PHYSICAL EXAMINATION:  GENERAL:  The patient is a morbidly obese male  whose wide awake and in no obvious distress. He appears to be very weak  and has quite a bit of difficulty sitting upright and actually cannot  sit up without assistance. Likewise he has difficulties moving his body  in general without the assistance of other individuals in the room.  VITAL SIGNS:  Temperature 98.6, blood pressure 99/65, heart rate 123,  respirations 20, O2 sat 93%.  HEENT:  Normocephalic, atraumatic. Extraocular movements intact. Oral  mucosa is pink. No thrush, no exudate.  NECK:  No JVD, no lymphadenopathy, no thyromegaly.  CARDIAC:  Heart sounds are distant.  RESPIRATORY:  No crackles or wheezes.  ABDOMEN:  Soft, nontender, nondistended. Hypoactive bowel sounds.  EXTREMITIES:  Right distal leg is erythematous in color.  NEUROLOGIC:  The patient is alert and oriented to person and place. The  year is in the 42s.   LABORATORY DATA:  The pH is 7.486, PCO2 47.6, PO2 is 108, bicarbonate  35.8, O2 sat is 98%. BNP is 272. WBC is 25.1, hemoglobin 14.0,  hematocrit 41.0, platelet count is 178. White blood cell count is 0-2.  Urinalysis negative.   ASSESSMENT/PLAN:  1. Altered mental status. It is questionable with regards to what      precipitated this. Again the patient is currently awake and he      states that he feels good; however, his children including his son      and  daughter are at the bedside and they indicate that the patient      is not at his baseline. They state that typically the patient is      much more talkative than he is currently. The differential as to      what is currently precipitating the patient's altered mental status      is questionable. The differential includes an infection process.      The patient does have an elevated white blood cell count however,      there is no history of fevers.  A urinalysis has been completed and      has been found to be negative. The ER physician also indicated that      a chest x-ray was done earlier and there did not appear to be a      pneumonia on chest x-ray. Likewise, one has to also consider any      type of a metabolic disturbance although on examination of the      patient's electrolytes at this particular time, no obvious      disturbance readily comes to mind. Will order a CT scan of the      patient's head to rule out any acute events. In light of the      leukocytosis will check blood cultures x2. Will start the patient      on empiric antibiotics. Will also check a TSH, T4, RPR, and a B12      as well as an ammonia level.  2. Leukocytosis. The source of the patient's leukocytosis is      questionable. Again the urinalysis has been negative and the chest      x-ray was reported as being negative for pneumonia. Again in light      of the very high white count will start the patient empirically on      antibiotics. Will check blood cultures x2. Currently the patient      does not demonstrate an nuchal rigidity, does not complain of a      headache and also indicates verbally that he feels pretty good.      Therefore, I am not inclined to consider an LP at this time.  3. Hypotension. There may be an element of dehydration present. It      appears that the patient does have a history of  hypertension per e-      chart. The patient also has documented congestive heart failure.      Therefore  may consider gentle IV fluid hydration while monitoring      his respiratory status very closely.  4. History of diabetes mellitus. Will resume his prior antidiabetic      medications.  5. History of atrial fibrillation. Will likewise resume the patient's      previous medications for this.  6. History of failure to thrive/dementia. Will monitor.  7. GI prophylaxis. Will provide Protonix.  8. DVT prophylaxis. Will provide Lovenox.      Michaelyn Barter, M.D.  Electronically Signed     OR/MEDQ  D:  02/17/2007  T:  02/17/2007  Job:  [id number]   cc:   Samara Snide, MD

## 2010-07-03 NOTE — Discharge Summary (Signed)
NAMEVIRAT, PRATHER               ACCOUNT NO.:  [id number]   MEDICAL RECORD NO.:  [id number]          PATIENT TYPE:  INP   LOCATION:  3738                         FACILITY:  [facility]   PHYSICIAN:  Madaline Savage, MD        DATE OF BIRTH:  10-29-1932   DATE OF ADMISSION:  02/17/2007  DATE OF DISCHARGE:  02/20/2007                               DISCHARGE SUMMARY   PRIMARY CARE PHYSICIAN:  Samara Snide, MD   DISCHARGE DIAGNOSES:  1. Delirium secondary to infection.  2. Right leg cellulitis.  3. Acute conjunctivitis of the right eye.  4. Diabetes mellitus.  5. Atrial fibrillation which is rate controlled.  6. Hypertension.  7. Hyperlipidemia.   DISCHARGE MEDICATIONS:  1. Clindamycin 450mg  four times daily . x 12 days.  2. Ciprofloxacin ophthalmic 0.3% solution one drop to right eye q.4 h      x1 week.  3. Cymbalta 60 mg daily.  4. Aspirin 81 mg daily.  5. Prilosec 20 mg daily.  6. Ultram one tablet q.6 h p.r.n.  7. Depakote ER 500 mg daily.  8. Glipizide 5 mg b.i.d.  9. Coreg 6.25 mg b.i.d.  10.Lantus insulin 43 units at bedtime.   HISTORY OF PRESENT ILLNESS:  For full History and Physical, see the  History and Physical dictated by Dr. Roxan Hockey on February 17, 2007.  In  short, Mr. Deeg is a 77 year old male with multiple medical problems  who was transferred from [facility] nursing facility with altered  mental status.  Apparently, the patient was confused and had tremors.  He was submitted for further evaluation and management of problems.   PROCEDURES:  1. CT of the head without contrast done on February 17, 2007, which      showed no acute intracranial abnormality.  Persistent      ventriculomegaly which is unchanged.  Persistent ethmoid and      sphenoid sinus disease.   PROBLEMS:  1. Altered mental status.  Mr. Millea was admitted with altered mental      status, confusion.  On admission, he was found to have cellulitis      on his right leg and was  started on antibiotics.  The next day he      was back to his baseline and he was observed over the last 48      hours, and his mental status has been unchanged.  I suspect his      altered mental status was delirium secondary to the infection which      is resolved at this time.  He had a CT of the head which was      negative, and he had a TSH and RPR and B12 which were all within      normal limits.  His CT of the head did show that he had dilated      ventricles which is unchanged from his previous CT in 2008.  He      also does not have incontinence, and does not have any gait      abnormalities.  I suspect the finding will need further evaluation      as an outpatient to make sure this is not normal pressure      hydrocephalus.  I will defer this workup as an outpatient per his      primary care doctor.  2. Right leg cellulitis.  Mr. Tengan was admitted, and on admission      his white count was elevated at 25,000 and it was found that his      right leg was red and warm to touch.  We have started him on      vancomycin empirically, and since then, his white count has      improved back to normal.  At this time, we will change it      toClindamycin and complete a 14-day course of antibiotics at this      time.  3. Right eye conjunctivitis.  He does have conjunctivitis in his right      eye.  I will start him on Ciprofloxacin for a total of 7 days.  4. History of atrial fibrillation.  He is on Coumadin.  His INR is      therapeutic at this time.   DISPOSITION:  He is now being discharged back to a skilled nursing  facility in stable condition.   FOLLOWUP:  He is asked to follow up with his primary care doctor, Dr.  Chaney Born in 1-2 weeks.      Madaline Savage, MD  Electronically Signed     PKN/MEDQ  D:  02/20/2007  T:  02/20/2007  Job:  [id number]   cc:   Samara Snide, MD

## 2010-07-03 NOTE — Assessment & Plan Note (Signed)
[facility]                            CARDIOLOGY OFFICE NOTE   NAME:[name], [name]                      MRN:          [id number]  DATE:12/17/2006                            DOB:          1932/03/18    DOD PHYSICIAN:  Dr. Jesse Sans. Wall.   HISTORY:  This is a 77 year old white male patient who was brought to  the office today, accompanied by his son.  The patient is currently  staying at [facility].  The patient has had multiple  admissions since August of this year and was actually initially  consulted by Dr. Dietrich Pates back in June.  The patient has had trouble  with chronic atrial fibrillation and right-sided congestive heart  failure.  He had increased edema and was admitted in August and his  diuretics were almost tripled.  He had a 2-D echocardiogram performed in  August that showed normal systolic function with an ejection fraction of  60%-70% and it was inadequate for wall motion abnormality, because of  his size.  The diastolic function parameters were normal.  The aortic  valve thickness was mildly increased.  The left atrium was moderately  dilated.  The RV was mildly to moderately dilated.  There was mild to  moderate tricuspid regurgitation.   The patient was readmitted in September with recurrent heart failure and  beta natriuretic peptide was slightly raised at 337.  He continued to  diurese, but unfortunately on December 02 2006, he was readmitted with  dehydration secondary to over-diuresis, as well as quinolone-resistant  E. coli urinary tract infection and failure to thrive.  He did not  tolerate ACE inhibitor.  He developed renal insufficiency.  His  diuretics were held.  He was scheduled to have outpatient renal artery  Dopplers at Dr. Balinda Quails' office on December 10, 2006, but he missed  this appointment.   Overall, since he has been home he was placed back on Lasix 40 mg daily.  He says his breathing is fine  and he actually feels better than he has  in a long time.  He has developed worsening edema in his lower  extremities.  He is currently brought in a wheelchair and has trouble  with his mobility.   CURRENT MEDICATIONS:  1. Zocor 40 mg daily.  2. Coumadin as directed.  3. Coreg 6.25 mg twice daily.  4. Celexa 20 mg q.h.s.  5. Depakote 500 mg daily.  6. Glipizide 5 mg twice daily.  7. Prilosec over-the-counter daily.  8. Aspirin 81 mg daily.  9. Lasix 40 mg daily.   PHYSICAL EXAMINATION:  GENERAL:  This is an obese, 77 year old white  male, in no acute distress.  VITAL SIGNS:  Blood pressure 146/74, weight 310 pounds, pulse 80.  NECK:  Without jugular venous distention, HJR, bruit or thyroid  enlargement.  LUNGS:  Clear anterior, posterior and lateral.  HEART:  A regular rate and rhythm at 80 beats per minute.  Normal S1 and  S2.  Positive S4.  Distant heart sounds secondary to size.  ABDOMEN:  Soft, without  organomegaly, masses, lesions or abnormal  tenderness.  EXTREMITIES:  With 2-3+ lower extremity edema with brawny venous stasis  changes, decreased by pressing distal pulses.   IMPRESSION:  1. History of congestive heart failure, felt secondary to diastolic      dysfunction in the past.  2. Renal insufficiency:  To have Doppler studies, to rule out renal      artery stenosis.  Did not tolerate ACE inhibitor recently.  3. Chronic atrial fibrillation.  4. History of right brain cerebrovascular accident in 2004.  5. Chronic Coumadin therapy.  6. Hypertension.  7. Hyperlipidemia.  8. Diabetes mellitus.  9. Depression.  10.History of deep venous thrombosis.  11.Obesity.  12.Venous stasis ulcers.  13.Normal adenosine Myoview in August 2004.   PLAN:  Overall, I think that the patient is stable and reluctant to  increase his diuretics, because he is feeling so well and his recent  renal insufficiency.  Will await his renal Dopplers, which he is going  to reschedule.  He may  need his Lasix bumped up in the future, but we  will check a BMET prior to doing this.   FOLLOWUP:  He will follow up with Dr. Tenny Craw in a couple of weeks.      Jacolyn Reedy, PA-C  Electronically Signed      Jesse Sans. Daleen Squibb, MD, [facility]  Electronically Signed   ML/MedQ  DD: 12/24/2006  DT: 12/24/2006  Job #: [id number]   cc:   Jamison Neighbor, M.D.  Balinda Quails, M.D.  Isidor Holts, M.D.

## 2010-07-03 NOTE — Discharge Summary (Signed)
[name], [name]               ACCOUNT NO.:  [id number]   MEDICAL RECORD NO.:  [id number]          PATIENT TYPE:  INP   LOCATION:  4715                         FACILITY:  [facility]   PHYSICIAN:  [name], M.D.DATE OF BIRTH:  13-Dec-1932   DATE OF ADMISSION:  11/10/2006  DATE OF DISCHARGE:  11/17/2006                               DISCHARGE SUMMARY   FINAL DISCHARGE DIAGNOSES:  1. Congestive heart failure, compensated.  2. Uncontrolled diabetes mellitus now better controlled.  3. Atrial fibrillation.  4. Left leg bilateral leg stasis ulcers, stable.   DISCHARGE MEDICATIONS:  1. Coumadin per pharmacy protocol.  Currently taking 7.5 mg daily.  2. Protonix 40 mg daily.  3. Lipitor 10 mg daily.  4. Sliding scale insulin.  5. Celexa 20 mg q.h.s.  6. Depakote 500 mg daily.  7. Lasix 120 mg t.i.d.  8. Glipizide 5 mg b.i.d.  9. Coreg 12.5 mg b.i.d.  10.Lantus insulin 40 units q.h.s.   CONDITION ON DISCHARGE:  Stable.   HISTORY:  This 77 year old man was admitted in acute shortness of breath  and was clinically found to be in congestive heart failure.  Please see  initial history and physical examination done by myself.   HOSPITAL PROGRESS:  The patient was admitted and aggressively diuresed  with intravenous Lasix an initially was on a nitroglycerin drip.  He  underwent a 2D echocardiogram which actually did not reveal any problems  with systolic function but because of his obesity, it was difficult to  assess.  His BNP was slightly raised at 337 which probably was the only  clue of clinical congestive heart failure.  He continued to do well with  diuresis and chest x-ray continued to improve.  He was given a short  course of steroids because of bronchospasm worries but once his diuresis  was almost complete he improved significantly and steroids were  discontinued.  He was seen by speech language pathology who did not feel  any trouble with diet and the assessment of an  FEES was unremarkable  with no signs or symptoms of aspiration.  His Lantus insulin was  adjusted in the hospital to control his diabetes.  Today he is doing  well and he is not short of breath.   On physical examination temperature 98.1, blood pressure 153/95, pulse  102, in atrial fibrillation, saturation 95% on room air.  Heart sounds  are present and somewhat irregular.  Lung fields are clinically clear.  His weight today is 138.4 kg.  On admission his weight was 150 kg, so he  has lost in the region of over 20 pounds.  His CBGs are much better  controlled with sugars in the 140s and 190s range.   Investigations today show a sodium of 138, potassium 3.3, bicarbonate  41, BUN 31, creatinine 1.06, INR 1.9, hemoglobin 12.6, white blood cell  count 10.2, platelets 214.  Chest x-ray yesterday shows no pulmonary  edema any more.   Further disposition: I am going to give him additional potassium  supplementation today before he is discharged to the skilled nursing  facility  and he needs to have his electrolytes checked on a regular  basis.      [name], M.D.  Electronically Signed     NCG/MEDQ  D:  11/17/2006  T:  11/17/2006  Job:  [id number]

## 2010-07-03 NOTE — Discharge Summary (Signed)
[name], [name]               ACCOUNT NO.:  [id number]   MEDICAL RECORD NO.:  [id number]          PATIENT TYPE:  INP   LOCATION:  4715                         FACILITY:  [facility]   PHYSICIAN:  Herbie Saxon, MDDATE OF BIRTH:  [birth year]   DATE OF ADMISSION:  11/10/2006  DATE OF DISCHARGE:  11/18/2006                               DISCHARGE SUMMARY   ADDENDUM:  [name]'s insulin is to be increased to 42 units subcutaneous  q.h.s.      Herbie Saxon, MD  Electronically Signed     MIO/MEDQ  D:  11/18/2006  T:  11/18/2006  Job:  [id number]

## 2010-07-03 NOTE — H&P (Signed)
[name], [name]               ACCOUNT NO.:  [id number]   MEDICAL RECORD NO.:  [id number]          PATIENT TYPE:  INP   LOCATION:  2906                         FACILITY:  [facility]   PHYSICIAN:  Wilson Singer, M.D.DATE OF BIRTH:  1932-12-26   DATE OF ADMISSION:  11/10/2006  DATE OF DISCHARGE:                              HISTORY & PHYSICAL   HISTORY:  This is a 77 year old man who has become acutely short of  breath today but says he has been getting progressively short of breath  for the last 2 weeks.  He was recently discharged from the hospital and  sent to the nursing home for rehabilitation.  He apparently did quite  well initially but then seems to have deteriorated with his breathing.  He also apparently had a fever.  There is no cough and today he was  told, based on x-ray finding at the nursing home, that he had a  pneumonia and he was started on antibiotics.  However, this did not  improve matters, he therefore, now presents here.   PAST MEDICAL HISTORY:  Previous history in the e-chart shows he has a  history of systolic congestive heart failure, history of DVT, insulin-  dependent diabetes mellitus, stroke in June of 2004, which resulted in  left face and arm weakness which apparently is now resolved,  hyperlipidemia, hypertension, history of atrial fibrillation and  depression.   MEDICATIONS:  Previously listed in the e-chart:  1. Lisinopril 20 mg daily.  2. Lasix 40 mg daily.  3. Crestor 5 mg on Mondays, Wednesdays, and Fridays.  4. Coumadin 7.5 mg daily.  5. Depakote ER 500 mg daily.  6. Lantus insulin 20 units q.h.s.  7. Glipizide 5 mg daily.  8. Citalopram 20 mg q.h.s.  9. Xanax 0.5 mg q.4-6 hours.  10.Hydrocodone 5 mg t.i.d.   DRUG ALLERGIES:  PENICILLIN.   FAMILY HISTORY:  Noncontributory.   REVIEW OF SYSTEMS:  Apart from the symptoms mentioned above, there are  no other symptoms referable to all systems reviewed.   PHYSICAL EXAMINATION:  VITAL  SIGNS:  Appears to be warm but there is no  temperature documented.  Blood pressure 158/98, pulse 88.  Appears to be  in sinus rhythm at the present time.  Saturation 100%, is on a CPAP  machine.  He appears pink and there is no central or peripheral  cyanosis.  His breathing is much more comfortable than apparently it was  earlier.  CARDIOVASCULAR:  Heart sounds are present and normal without any  murmurs.  There is no obvious gallop rhythm.  Jugular venous pressure is  not raised.  There is peripheral pitting edema and he does have scrotal  edema.  RESPIRATORY:  Lung fields are difficult to hear as he has a large chest  wall and he is obese.  I cannot hear any obvious crackles but apparently  emergency services told the daughter that he had crackles up to his  neck.  ABDOMEN:  Soft and nontender with no hepatosplenomegaly.  EXTREMITIES:  Have peripheral pitting edema in his legs.  NEUROLOGICAL:  He is  alert and oriented.  There are no obvious focal  neurologic signs.   INVESTIGATIONS:  Chest x-ray is consistent with pulmonary edema.  Sodium  137, potassium 4.3, BUN 17, creatinine 1.5, glucose 113, hemoglobin  13.6.   IMPRESSION:  1. Congestive heart failure.  2. Possible chest infection.  3. Insulin-dependent diabetes mellitus.  4. Cerebrovascular disease.  5. Hypertension.  6. Atrial fibrillation.  7. Depression.   PLAN:  1. Admit to stepdown unit.  2. Intravenous Lasix.  3. Obtain echocardiogram and serial cardiac enzymes.  4. Consider cardiology consultation.  5. Control hypertension and diabetes mellitus.   Further recommendations will depend on the patient's hospital progress.      Wilson Singer, M.D.  Electronically Signed     NCG/MEDQ  D:  11/10/2006  T:  11/11/2006  Job:  [id number]   cc:   [facility]

## 2010-07-03 NOTE — Discharge Summary (Signed)
[name], [name]               ACCOUNT NO.:  [id number]   MEDICAL RECORD NO.:  [id number]          PATIENT TYPE:  INP   LOCATION:  4715                         FACILITY:  [facility]   PHYSICIAN:  Herbie Saxon, MDDATE OF BIRTH:  [birth year]   DATE OF ADMISSION:  11/10/2006  DATE OF DISCHARGE:  11/18/2006                               DISCHARGE SUMMARY   ADDENDUM:   DISCHARGE CONDITION:  Stable.   DISCHARGE DIAGNOSIS:  Remains the same.   The radiology shows chest x-ray of November 16, 2006 shows cardiac  enlargement without acute pulmonary process.  Chest x-ray on November 10, 2006 shows asymmetric edema and failure.   EXAMINATION:  VITAL SIGNS:  Temperature 98, pulse 85, respiratory rate  is 20, blood pressure 120/76.  GENERAL:  He is hemodynamically stable.  HEART:  Heart sounds 1, 2, and 3.  LUNGS:  Reduced breath sounds at the bases.  ABDOMEN:  Benign.  PSYCH:  He is alert, oriented x3.  EXTREMITIES:  Peripheral pulses present, trace pedal edema.   LABS:  Show a glucose of 175.  INR is 2.1.  sodium 138, chloride 92,  bicarbonate 241, glucose 138, BUN 31, creatinine 1.0.  Potassium on  November 17, 2006 was 3.3.   Patient is to have 40 mEq KCl start prior to going to the nursing  facility and is to be on KCl 20 mEq p.o. b.i.d.  Monitor his BMP in 3  days and possibly reducing his Lasix dose from 120 t.i.d. to probably 80  mg b.i.d.      Herbie Saxon, MD  Electronically Signed     MIO/MEDQ  D:  11/18/2006  T:  11/18/2006  Job:  [id number]

## 2010-07-03 NOTE — Consult Note (Signed)
NAMETRAYVON, TRUMBULL               ACCOUNT NO.:  [id number]   MEDICAL RECORD NO.:  [id number]          PATIENT TYPE:  INP   LOCATION:  5729                         FACILITY:  [facility]   PHYSICIAN:  Jamison Neighbor, M.D.  DATE OF BIRTH:  24-Dec-1932   DATE OF CONSULTATION:  12/08/2006  DATE OF DISCHARGE:                                 CONSULTATION   REASON FOR CONSULTATION:  Possible need for circumcision or dorsal slit  of foreskin.   HISTORY:  This 77 year old male was admitted to the hospital on 12/02/06  for evaluation of weakness and congestive heart failure..  The patient  does have a history of previous CVA, congestive heart failure and  chronic atrial fibrillation..  The patient was seen by me during his  last admission at which point was sent to nursing  home on Lasix plus  Zaroxolyn.  The patient has been somewhat weak.  He was found to have a  urinary tract infection at the time of admission and was given IV  fluids.  He is now ready for discharge.  Urologic consultation was  sought to determine if anything needed to be done with his foreskin   When the patient was last admitted he had marked edema and a Foley  catheter was required for management of congestive failure.  It was  exceptionally difficult to insert the foreskin because of this  significant edema and it was recommended that he be considered for  circumcision once he was stabilized..  The patient has had a Foley  catheter.  It has been a little difficult to control the urine.  He now  is  doing a little bit better and is ready for discharge.  I discussed  this over the phone with the [facility] physician and suggested that we  could do a circumcision on an elective basis if necessary and if  possible this could even be done under local plus IV sedation. I am now  to see the patient to discuss whether this is appropriate.   PAST MEDICAL HISTORY:  Remarkable for atrial fibrillation, right-sided  CVA in 2004,  hypertension, elevated cholesterol, diabetes, depression,  pneumonia, obesity, history of DVT, history of venous stasis ulcers.   On examination today he is well-developed, well-nourished male.  He  still remains overweight but his edema has decreased very nicely.   The patient's Foley catheter is now out,  his foreskin is easily  reduced.  The meatus is normal.  The glans is normal.   The record indicated that the patient was thought to have a urinary  tract infection because he was sent home with a Foley catheter. His  urine shows significant WBCs in the urine.  With the catheter out it  should be relatively easy to clear that.   The principle question whether this patient needs have a circumcision.  I talked with the patient about this and did suggest that this certainly  would be worthwhile thing to consider.  However, he really has an easier  tract foreskin. He does not has not have any stenosis.  His edema is  all  gone and I think this can be left well enough alone.  He is in favor of  not doing anything.  Given his significant past medical history we would  certainly not want to put this patient asleep for this and would need to  do this under local.  At this point there is really not much indication  for this.   I think as long as he is carefully monitored and does not have return of  his fluid overload, it should be easier to place a catheter should the  need arise.  I would be happy to see the patient on an as-needed basis.      Jamison Neighbor, M.D.  Electronically Signed     RJE/MEDQ  D:  12/08/2006  T:  12/09/2006  Job:  [id number]   cc:   Rosalyn Gess. Norins, MD  [facility]

## 2010-07-03 NOTE — H&P (Signed)
NAMESHERLEY, LESER NO.:  [id number]   MEDICAL RECORD NO.:  [id number]          PATIENT TYPE:  OBV   LOCATION:  5729                         FACILITY:  [facility]   PHYSICIAN:  Hettie Holstein, D.O.    DATE OF BIRTH:  10/21/1932   DATE OF ADMISSION:  12/02/2006  DATE OF DISCHARGE:                              HISTORY & PHYSICAL   PRIMARY CARE PHYSICIAN:  Dr. Livingston Diones at [facility].   CHIEF COMPLAINT:  Weakness and inability to stand up.   HISTORY OF PRESENT ILLNESS:  Mr. Coia is a 77 year old male who was  recently released from the hospital a few weeks ago with pneumonia.  In  any event, he was discharged to the skilled nursing facility, had been  doing fairly well with therapy but, however, until recently he has not  been able to stand.  He has been quite weak and the failing to thrive,  therefore he was sent to the emergency department.  Apparently he was  sent with a Foley catheter.  His urine does show significant WBCs in his  urine.  He does not have focal neurologic deficits in the emergency  department.  He does have head CT scan just revealing stable  ventriculomegaly, certainly as possibility of normal pressure  hydrocephalus.  Though he is on anticoagulant, I think that this is  something that neurology generally works out in the outpatient setting.  With any event we will admit him for further observation over the next  23 hours.  He does look clinically dry on exam.  He has been on Lasix  regularly at skilled facility.  We will give him some gentle IV  hydration and see how he appears over the next 24 hours.   PAST MEDICAL HISTORY:  1. Significant for history of CVA, frontoparietal.  This was about 4      years ago.  He had rehabilitated from this event.  2. History of atrial fibrillation rate controlled on chronic      anticoagulation though subtherapeutic this admission.  3. History of diabetes mellitus on insulin.  4.  Hypertension.  5. Hyperlipidemia.  6. Obesity.  7. History of DVT.  8. History of venostasis ulcers.  9. His last 2D echocardiogram prior to being discharged to the skilled      facility revealed left ventricular wall fraction of 60 to 70%. Some      mild-to-moderate dilation of the right ventricular, preserved LV      function as described above.  Surprisingly no evidence of pulmonary      artery hypertension.   MEDICATIONS:  1. Coumadin adjusted a skilled facility.  2. Coreg 12.5 mg twice daily.  3. Celexa 20 mg at bedtime.  4. Depakote 500 mg once a day.  5. Glipizide 5 mg p.o. b.i.d.  6. Lantus 40 units at bedtime.  7. Lasix 120 mg 3 times a day.  8. Lipitor 10 mg daily.  9. Prilosec 20 mg daily.   ALLERGIES:  PENICILLIN.   FAMILY/SOCIAL HISTORY:  Please refer to the HPI just recently dictated  a  few weeks ago.  This has remained unchanged.   REVIEW OF SYSTEMS:  Has been unremarkable.  He does have a chronic Foley  catheter from prior discharge in his usual state of health.  He has been  in skilled nursing facility since fall about 4 months ago.  Prior to  this, he was living at home, retirement type setting with his wife.  In  any event, he is here with his granddaughter and he is a nonsmoker,  nondrinker.  Formally worked as running a [employer], phone  [phone], cell phone [phone].   PHYSICAL EXAMINATION:  VITAL SIGNS:  In the emergency department his  vital signs were as follows:  Blood pressure 122/71, heart rate 74,  respirations 18, O2 saturation 99%, temperature 97.4.  HEENT:  Reveals the head to be normocephalic, atraumatic.  Extraocular  muscles intact.  NECK: Supple and nontender. Palpable thyroid, no organomegaly or mass.  CARDIOVASCULAR:  Reveals normal S1 and S2.  Irregularly irregular.  Rate  is controlled  LUNGS:  Exhibits normal effort.  Has no dullness to percussion.  ABDOMEN:  Soft, obese, nontender.  EXTREMITIES:  Lower extremities  reveal only venostasis appearance.  There is no calf tenderness.  He has surgical scar above his right knee,  otherwise he moves all 4 extremities spontaneously.   LABORATORY DATA:  Sodium 142, potassium 4.3, BUN 22, creatinine 1.08,  chloride 94, CO2 42, glucose 204, AST/ALT 25/36, albumin 3.0, INR 1.6.  WBC 7.1, hemoglobin 12, platelet count 189.  CT of his head revealed  diffuse ventriculomegaly.  This is stable in appearance, though  considerations for NPH is stated.  Ethmoid and sphenoid sinus disease.   ASSESSMENT:  1. Complicated urinary tract infection, complicated by Foley catheter.  2. Failure to thrive.  3. Dehydration, volume depletion, probably due to diuretics.  4. Chronic venostasis.  5. History of atrial fibrillation, rate controlled, subtherapeutic      INR.  6. Poorly controlled diabetes.  7. Obesity.  8. CT scan of the brain reveals some ventriculomegaly.  Certainly      would warrant some neurology followup once he is more ambulatory.  9. Diabetes.   PLAN:  At this time we are going to admit Mr. Sinning as observation  status, administer gentle IV hydration and follow his clinical course  over the next 24 hours and assess response.  Treat with antibiotics and  obtain urine culture to assist in adjusting medications.  We will  discontinue the Foley catheter.  It appears that he is able to use  bedside urinal or condom catheter.      Hettie Holstein, D.O.  Electronically Signed     ESS/MEDQ  D:  12/02/2006  T:  12/03/2006  Job:  [id number]   cc:   Livingston Diones, MD

## 2010-07-03 NOTE — H&P (Signed)
[name], [name]               ACCOUNT NO.:  [id number]   MEDICAL RECORD NO.:  [id number]          PATIENT TYPE:  INP   LOCATION:  4707                         FACILITY:  [facility]   PHYSICIAN:  Beckey Rutter, MD  DATE OF BIRTH:  [birth year]   DATE OF ADMISSION:  09/25/2006  DATE OF DISCHARGE:                              HISTORY & PHYSICAL   PRIMARY CARE PHYSICIAN:  The patient is unassigned to [facility] though  he was for some time following with Dr. Birdie Sons, but he said that  he is no longer following with him, and now his primary is Dr. Redmond School.   CHIEF COMPLAINT:  Shortness of breath.   HISTORY OF PRESENT ILLNESS:  This is a 77 year old Caucasian male with  past medical history significant for diabetes, hypertension, stroke, and  lower extremity deep vein thrombosis who presented today with shortness  of breath that started yesterday and progressively worsening.  The  patient sought relief by using his wife's oxygen with improvement of his  shortness of breath.  Then he presented himself to the emergency room  here.  The patient denies significant cough associated with these  symptoms.  He also denied nausea and vomiting or headache, but he  noticed that he has lower extremity swelling that has increased in size.  He was also hearing wheezes at home before his presentation.  The  patient is seen at home basis with a home visiting service.  The  physician assistant seeing him was [facility], and the physician in charge is  Dr. Redmond School.  For some time the patient was seeing Dr. Birdie Sons with  [facility], but he admits no longer he is following with Dr. Cato Mulligan.   MEDICATION ALLERGIES:  PENICILLIN.   MEDICATIONS:  1. Taztia XT 180 mg daily.  2. Toprol XL 100 mg daily.  3. Fosinopril 20 mg daily.  4. Lasix 40 mg daily.  5. Crestor 100 mg 1/2 tab taken Monday, Wednesday, and Friday.  6. Metformin 1000 mg twice a day.  7. Coumadin 5 mg at night on Sunday, Monday,  Wednesday, Friday, and      Saturday.  8. Depakote ER 500 mg daily.  9. Lantus 50 units at night.  10.Stool softener.  11.Glipizide 5 mg at night.  12.Citalopram 20 mg at night.  13.The patient also taking Xanax 0.25 mg p.r.n. as needed and      promethazine 25 mg as needed as well as hydrocodone 5 mg as needed.   PAST MEDICAL HISTORY:  Significant for diabetes, hypertension,  cerebrovascular accident, and history of deep vein thrombosis.  He is  still taking Coumadin for that.   SOCIAL HISTORY:  No drug abuse, not a drinker, not a smoker.   FAMILY HISTORY:  Noncontributory.   REVIEW OF SYSTEMS:  As per HPI.   PHYSICAL EXAMINATION:  GENERAL:  The patient was lying comfortable in  bed not in acute distress.  HEENT:  HEAD:  Atraumatic, normocephalic.  EYES:  PERRL.  Mouth moist.  No ulcer.  NECK:  Supple.  No JVD.  LUNGS:  The patient has  bilateral air entry.  HEART:  Precordium examination:  First, second heart sounds audible.  ABDOMEN:  Obese.  Bowel sounds present.  No tenderness.  EXTREMITIES:  The patient has lower extremity edema especially in the  thigh area and immediately above knees.  NEUROLOGICAL:  Patient is  alert, oriented x3, moving all his extremities spontaneously.  VITAL SIGNS:  His vitals recorded in the emergency room showing  temperature 98.5, blood pressure 136/64, pulse 78, respiratory rate 16.   LABORATORY DATA:  PTT 43, PT 27.4, INR 2.5. BNP 215.  White blood count  8.6, hemoglobin 11.3, hematocrit 33.8, platelets 228.  Troponin less  than 0.05, CK-MB 1.8, myoglobin 251.  Creatinine 1.5.  Sodium 137,  potassium 5, chloride 102, glucose 94, BUN 43.  His PCO2 in the ABG is  63.6.   Chest x-ray:  Preliminary report was reading cardiomegaly with pulmonary  vascular congestion and possibly mild interstitial edema.  Mild right  basilar atelectasis, air space disease, pneumonia not excluded.   ASSESSMENT AND PLAN:  This is a 77 year old male came with  shortness of  breath with evidence of congestion in the chest x-ray though his BNP is  not elevated.  He is not known to have heart disease previously.  The  picture seems to be mixed now with  chronic obstructive pulmonary  disease picture versus congestive heart failure.  Though his obesity  could be a contributing factor to his chronic obstructive pulmonary  disease as well especially in the background of infection/chronic  obstructive pulmonary disease or even with the congestive heart failure.  I will continue the patient's management with Lasix.  Also daily weight  and check I's and O's pending the echocardiogram results for further  assessment of his left ventricular ejection fraction.  In the meanwhile  I will also add antibiotic for his management to treat the possible  right lower lobe pneumonia or the chronic obstructive pulmonary picture.  Will continue the patient on nebulizer medication, and we will continue  his diabetic medication during this hospitalization.   The patient has bilateral lower extremity edema.  It could be secondary  to his previous deep venous thrombosis. Nevertheless, I will request a  new  Doppler pending the result of all the studies to be brought to  reassess for new incidence of deep venous thrombosis.  The patient will  be started on Protonix for gastrointestinal protection during this  hospitalization.      Beckey Rutter, MD  Electronically Signed     EME/MEDQ  D:  09/26/2006  T:  09/26/2006  Job:  [id number]

## 2010-07-03 NOTE — Discharge Summary (Signed)
[name], [name]               ACCOUNT NO.:  [id number]   MEDICAL RECORD NO.:  [id number]          PATIENT TYPE:  INP   LOCATION:  4708                         FACILITY:  [facility]   PHYSICIAN:  Madaline Savage, MD        DATE OF BIRTH:  16-Nov-1932   DATE OF ADMISSION:  09/25/2006  DATE OF DISCHARGE:  09/30/2006                               DISCHARGE SUMMARY   ADDENDUM:  [name] was being planned to be discharged home with home  health therapy.  On further discussion with his daughter and his son,  who are the caretakers for him, they do not think they can care for him  at home.  They are concerned that he is not able to walk to the bathroom  and that he is going to fall.  We will arrange for a short-term skilled  nursing home stay.  He will continue on the same medications as before,  and he will get dressing changes at the nursing home.  He is asked to  follow up with his regular doctor within a week of discharge from the  nursing home.      Madaline Savage, MD  Electronically Signed     PKN/MEDQ  D:  09/30/2006  T:  09/30/2006  Job:  [id number]

## 2010-07-03 NOTE — Assessment & Plan Note (Signed)
[facility]                            CARDIOLOGY OFFICE NOTE   NAME:Wiedel, HENRIQUE PAREKH                      MRN:          [id number]  DATE:03/13/2007                            DOB:          1932/04/01    IDENTIFICATION:  Mr. Savini is a 77 year old who I actually saw back in  June while in the hospital.  He has a history of CVA, chronic atrial  fibrillation, right-sided CHF.  He has been admitted with over-diuresis,  volume overloaded.  Most recently he was admitted with an infection  secondary to cellulitis (just discharged on January 2).  He was set up  here for continued followup care.   On discharge it looks like he was discharged not on Lasix.  He has been  on quite high doses in the past but again, has had over-diuresis in the  past.   Currently, he says he is doing fairly well.  He denies chest pains, says  his breathing is okay.  He does note snoring at night.  Nobody is around  to hear if he has apneic spells.  He has some lower extremity edema,  thinks it looks as it usually does.   His current medications include:  1. Coumadin as directed.  2. Coreg 6.25 b.i.d.  3. Depakote ER 500.  4. Glipizide 5 b.i.d.  5. Prilosec OTC 20.  6. Aspirin 81.  7. Lantus insulin 43 units q.h.s.  8. Cipro ophthalmic drops 0.3%.  9. Clindamycin q.i.d. x12 days.  10.Cymbalta 60.  11.Lovenox 140?  12.Aricept 5 mg x1 month, then 10.   PAST MEDICAL HISTORY:  1. Chronic atrial fibrillation.  2. History of a right CVA 2004.  3. Hypertension.  4. Hyperlipidemia.  5. Diabetes.  6. Depression.  7. History of pneumonia.  8. History of cellulitis.  9. Obesity.  10.DVT.  11.Venous stasis ulcers.  12.Chronic dyspnea.  13.Right-sided CHF.  Note, echocardiogram done in September showed      normal LV function, LVEF of 60-70%, difficult acoustic windows.      The right ventricle is noted to be mild to moderately dilated.  No      comment on function at that  time with mild to moderate TR.      Estimated PA pressure, though, was only 29 mmHg.   PHYSICAL EXAM:  On exam, the patient is currently in no acute distress,  sitting in a wheelchair.  Blood pressure is 140/80, pulse is 77 and  irregular.  LUNGS:  Are relatively clear.  NECK:  No visible JVD.  CARDIAC EXAM:  Regular rate and rhythm.  S1, S2.  No S3, no significant  murmurs.  ABDOMEN:  Obese, benign.  EXTREMITIES:  Chronic stasis changes, 1-2+ edema and ruddy skin color.   A 12-lead EKG shows atrial fibrillation at 77 beats per minute.  Right  bundle-branch block, now complete.  Motion artifact.   IMPRESSION:  1. Mr. Boateng is a very complicated gentleman who presents for      continued care.  I think he had seen Dr. Lewayne Bunting in the past.  I      saw him once.  On examination today, he has some increased volume      but he has had admissions for dehydration.  I am reluctant without      knowing his blood work to see what to do with the Lasix.  I would      keep him on the same medications for now, check a BMET, CBC and BNP      and be back in touch with the patient.  2. Question sleep apnea.  I would refer to pulmonary for a sleep study      as I am concerned he may have it and it would be exacerbating the      above problems.  3. Gastroesophageal reflux on over-the-counter agents.  4. Diabetes on agents as noted.  5. Dyslipidemia, had been on Zocor in the past.  I am not sure why      this had been discontinued.  Again, I will need to address this on      next visit.   I will set followup based on the blood work, tentatively keep now at 4  weeks.     Pricilla Riffle, MD, [facility]  Electronically Signed    PVR/MedQ  DD: 03/13/2007  DT: 03/14/2007  Job #: [id number]

## 2010-07-03 NOTE — Discharge Summary (Signed)
NAMEJASAIAH, [name]               ACCOUNT NO.:  [id number]   MEDICAL RECORD NO.:  [id number]          PATIENT TYPE:  INP   LOCATION:  4708                         FACILITY:  [facility]   PHYSICIAN:  Madaline Savage, MD        DATE OF BIRTH:  1932-09-14   DATE OF ADMISSION:  09/25/2006  DATE OF DISCHARGE:  09/30/2006                               DISCHARGE SUMMARY   PRIMARY CARE PHYSICIAN:  Dr. Redmond School.  This patient is unassigned to [place].   DISCHARGE DIAGNOSES:  1. Dyspnea which is multifactorial.  2. Acute bronchitis versus early pneumonia.  3. Chronic systolic heart failure which is compensated.  4. History of deep venous thrombosis.  5. Diabetes mellitus.  6. Lower extremity bilateral venous stasis ulcers.  7. History of cerebrovascular accident.   DISCHARGE MEDICATIONS:  1. Avelox 400 mg once daily for three more days.  2. Medilex border to be applied to right lower extremity and changed      on Monday's and Friday's.  3. Medilex Lite to be changed on Monday, Wednesday and Friday, to be      applied to the right lower extremity.  4. Taztia XT 180 mg daily.  5. Toprol XL 100 mg daily.  6. Fosinopril 20 mg daily.  7. Lasix 40 mg daily.  8. Crestor 100 mg one-half tablet to be taken on Monday, Wednesday and      Friday.  9. Metformin 1,000 mg twice daily.  10.Coumadin 5 mg at night on Sunday, Monday, Wednesday, Friday and      Saturday.  11.Depakote ER 500 mg daily.  12.Lantus 50 units at night.  13.Glipizide 5 mg daily at night.  14.Citalopram 20 mg at night.  15.Xanax 0.25 mg as needed.  16.Hydrocodone 5 mg three times daily as needed.   HISTORY OF PRESENT ILLNESS:  For a full history and physical, see the  history and physical dictated by Dr. Tamsen Roers on September 26, 2006.  In  short, [name] is a 77 year old gentleman with a history of diabetes,  hypertension and stroke and a history of heart failure who came in with  complaints of shortness of breath.  His initial evaluation  showed that  he was afebrile with normal white count but his x-rays showed  questionable edema.  His BNP was only 215.  He was admitted with a  presumptive diagnosis of acute bronchitis versus decompensated heart  failure.   PROCEDURES DONE IN HOSPITAL:  1. He had an echocardiogram done on September 26, 2006 which showed      limited images.  Overall left ventricular function is good.  The LV      size was upper limits of normal and left ventricular wall thickness      was mildly increased.  2. He had an x-ray of the chest, two view done on September 25, 2006 which      showed cardiomegaly with pulmonary vascular congestion and      questionable mild interstitial edema.   PROBLEM LIST:  1. Dyspnea.  His dyspnea is most likely multifactorial.  He had  probably mild decompensation of his heart failure but he had a      normal BNP.  He also likely had acute bronchitis versus early      pneumonia.  On exam he had scattered wheezes on admission and he      responded well to Avelox.  2. Early pneumonia versus acute bronchitis.  He was admitted and      started on Avelox and was put on nebulizer's with good improvement      of his breathing difficulty.  We did a D-dimer on him which was      negative, which ruled out venous thromboembolism.  We also did      cardiac enzymes which were negative.  Our plan would be to continue      treating him with antibiotics for a total of 7 days.  He is now off      the oxygen.  3. History of heart failure.  He did have questionable mild pulmonary      edema on admission and he was treated with an increased dose of      Lasix which was decreased back to his home dose as he did not      appear to be volume overloaded clinically.  He is now on his home      dose of Lasix with no signs of volume overload and his breathing is      at baseline.  He did have an echocardiogram here which did not show      much systolic dysfunction but this was a limited study.  I  have      advised him to follow up with his heart doctor in a couple of      weeks.  4. History of deep venous thrombosis.  He will continue to be on      Coumadin.  His INR has to be watched while he is on the antibiotics      for three more days.  5. Diabetes mellitus.  He did have an elevated hemoglobin A1C of 8.2.      His sugars have been under reasonable control while he was in the      hospital.  We will continue him on the same doses of medications.      They may need to be adjusted as an outpatient.  6. Lower extremity venous stasis ulcers.  He was seen by wound care      team while he was in the hospital and they recommended to continue      with Medilex border on his lower extremities.  The goal would be to      increase the granulation tissue size.  7. Deconditioning.  He was seen by physical therapy and occupational      therapy in the hospital who recommended home health therapy.  We      will arrange this for him at home.   DISPOSITION:  At this time he is being discharged home in a stable  condition with home health therapy.  We will ask the home health therapy  to do his dressing changes.  I have asked him to follow up with his  family doctor, Dr. Redmond School in one week.      Madaline Savage, MD  Electronically Signed     PKN/MEDQ  D:  09/30/2006  T:  09/30/2006  Job:  [id number]

## 2010-07-03 NOTE — Discharge Summary (Signed)
[name]               ACCOUNT NO.:  [id number]   MEDICAL RECORD NO.:  [id number]          PATIENT TYPE:  INP   LOCATION:  5729                         FACILITY:  [facility]   PHYSICIAN:  Isidor Holts, M.D.  DATE OF BIRTH:  February 10, 1933   DATE OF ADMISSION:  12/02/2006  DATE OF DISCHARGE:  12/09/2006                               DISCHARGE SUMMARY   DISCHARGE DIAGNOSES:  1. Dehydration secondary to over diuresis.  2. 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  3. Failure to thrive.  4. Atrial fibrillation.  5. Chronic anticoagulation.  6. History of congestive cardiomyopathy.  7. Type 2 diabetes mellitus.  8. Dyslipidemia.  9. Prior history of deep venous thrombosis.  10.Dementia.   DISCHARGE MEDICATIONS:  1. Coreg 6.25 mg p.o. b.i.d.  2. Celexa 20 mg p.o. q.h.s.  3. Depakote 500 mg p.o. daily.  4. Glipizide 5 mg p.o. b.i.d.  5. Lantus 43 units subcutaneously q.h.s.  6. Lasix 40 mg p.o. daily.  7. Lipitor 10 mg p.o. daily.  8. Prilosec 20 mg p.o. daily.  9. Aspirin (enteric-coated) 81 mg p.o. daily.  10.Keflex 500 mg p.o. b.i.d. to be completed on December 12, 2006.  11.Coumadin per INR (target INR 2-3). Currently on 7.5 mg p.o. at      6:00pm daily.   PROCEDURES:  Head CT scan dated December 02, 2006.  This showed stable  appearance of the brain.  No acute intracranial abnormality.  Diffuse  ventriculomegaly is stable, but ethmoid and sphenoid sinus disease is  new.   CONSULTATIONS:  1. Dr. Marcelyn Bruins, urologist.  2. [facility].   HISTORY:  Admission history as in H&P notes of December 02, 2006 dictated  by Dr. Hannah Beat.  However, in brief, this is a 77 year old male, with  known history of congestive cardiomyopathy, atrial fibrillation on  chronic anticoagulation, previous history of DVT, hypertension,  cerebrovascular disease status post previous CVA, dementia, type 2  diabetes mellitus, dyslipidemia, status post  hospitalization at [facility] from November 10, 2006 to September 31, 2008 for  congestive heart failure and possible pneumonia.  He was discharged to a  skilled nursing facility and reportedly had been doing well with  physical therapy/rehab, but quite recently he has become quite weak and  has failure to thrive.  He was admitted for further evaluation,  investigation and management.   CLINICAL COURSE:  1. Dehydration.  This is secondary to over diuresis.  The patient was      on rather large doses of Lasix, i.e. 120 mg p.o. t.i.d. pre-      admission.  He presents with a BUN of 22, creatinine 1.08.      Diuretics were of course discontinued.  He was commenced on      intravenous fluid hydration.  By December 04, 2006, his BUN was 17      with a creatinine of 1.08.  The patient felt considerably better,      much more alert and energetic.  Intravenous fluids were therefore      discontinued.  At this point, it  was felt that it was quite      appropriate to reinstate him gingerly on heart failure medication      including diuretics.  Lasix was therefore instituted in an initial      dose of 60 mg p.o. b.i.d. and subsequently dropped to 40 mg p.o.      b.i.d.  Low-dose ACE inhibitor, i.e. 5 mg daily was added to his      treatment.  Unfortunately, this resulted in an alarming bump in      serum creatinine level overnight from 1.28 to 2.47 mL raising the      specter of possible renal artery stenosis.  ACE inhibitor was of      course discontinued, as were diuretics.  The patient was restarted      on intravenous fluids.  W were pleased to note that as of December 08, 2006, BUN had improved at 1.94.  Further improvement is      anticipated.  We have discussed with Dr. Liliane Bade, vascular      surgeon,  about imaging of the patient's renal arteries.  He has      agreed to perform an outpatient  renal artery Doppler in his office      on December 10, 2006.   1. Quinolone  resistant Escherichia coli urinary tract infection.  The      patient presents with a positive urinary sediment consistent with      urinary tract infection, and was initially commenced on      Ciprofloxacin.  However, subsequent urine cultures grew over      100,000 colonies of E-coli resistant to the quinolones.  The      patient has therefore been placed on a course of oral Keflex 500 mg      p.o. b.i.d. to be completed on December 13, 2006, i.e. a 7 day      course.  During the course of hospitalization, the patient had no      recorded pyrexial episodes.  His white cell count remained within      normal limits.   1. Phimosis.  The patient during his latest hospitalization was found      to have a severe phimosis making it impossible to insert a Foley      catheter and requiring consultation by Dr. Marcelyn Bruins, urologist,      who subsequently was able to with some difficulty  and over a      guidewire inserted a Foley catheter.  At that time, Dr. Logan Bores had      cautioned that the Foley catheter not be removed until the patient      could have either circumcision or a dorsal slit of his prepuce.  At      the time of presentation on this occasion however, Foley catheter      was removed by admitting MD.  The patient was therefore managed      with a condom catheter, although occasionally he did experience      incontinence related to slipping of the condom catheter.  This      resulted of course in bed wetting from time to time, placing him at      some risk for decubiti.  In view of this, urology consultation was      requested from Dr. Marcelyn Bruins with a view to possibly arranging a      definitive procedure as soon  as feasible.   1. History of congestive heart failure.  The patient showed no      clinical evidence of congestive heart failure during the course of      his hospitalization.  BNP remained consistently within normal      limits.  There was no increase in weight gain,  in spite of daily      weighing.  Clinically, there was no evidence of heart failure.      Certainly his lung fields were clear and chest x-ray showed no      evidence of pulmonary edema.  At the insistence of the patient's      family, however, it was felt appropriate to invite [facility] to whom the patient is well-known, on consultation and      they were helpful in rationalizing his heart failure medications.   1. Hypertension.  The patient remained normotensive throughout the      course of his hospitalization.   1. Type 2 diabetes mellitus.  The patient continues on pre-admission      diabetic medications.  However, Lantus dosage was titrated as      indicated, to maintain euglycemia.   1. Dyslipidemia.  The patient continues on pre-admission statin      treatment   1. Failure to thrive.  This is likely multifactorial, secondary to the      patient's acute problems.  Within the first couple days of      hospitalization, the patient's oral intake improved and as at the      time of this dictation on December 08, 2006, he showed no evidence      of failure to thrive, whatsoever.   1. Anticoagulation.  This was managed by the clinical pharmacologist      during the course of the patient's hospitalization.  Clearly he      will need titration of his anticoagulation by his primary MD      following discharge, according to INR.   DISPOSITION:  Provided no acute problems arise in the interim, it is  anticipated the patient will be discharged on December 09, 2006.   ACTIVITY:  As tolerated, otherwise per PT/OT.   DIET:  Heart-healthy/carbohydrate modified.   FOLLOW-UP INSTRUCTIONS:  1. The patient is instructed to follow up with [facility],      [address] telephone number [phone]  on December 13, 2006 at1:30 p.m.  2. In addition, an appointment has been arranged with Dr. Liliane Bade,      vascular surgeon on December 10, 2006 for renal  artery Doppler.      Telephone number [phone].  3. The patient is also to follow up with Dr. Marcelyn Bruins, urologist      on the date to be arranged, for possible scheduled definitive      treatment of his phimosis.  4. The patient is also to follow up routinely with his primary MD, Dr.      Woodroe Mode, [facility].      Isidor Holts, M.D.  Electronically Signed     CO/MEDQ  D:  12/08/2006  T:  12/09/2006  Job:  [id number]   cc:   Jonelle Sidle, MD  Jamison Neighbor, M.D.  Balinda Quails, M.D.

## 2010-07-06 NOTE — Consult Note (Signed)
NAMEMACINTYRE, Peter                         ACCOUNT NO.:  [id number]   MEDICAL RECORD NO.:  [id number]                   PATIENT TYPE:  INP   LOCATION:  3007                                 FACILITY:  [facility]   PHYSICIAN:  Jonelle Sidle, M.D. [facility]        DATE OF BIRTH:  1932-11-20   DATE OF CONSULTATION:  08/12/2002  DATE OF DISCHARGE:                                   CONSULTATION   PRIMARY CARE PHYSICIAN:  Rosalyn Gess. Norins, M.D. [facility].   REFERRING PHYSICIAN:  Genene Churn. Love, M.D.   REASON FOR CONSULTATION:  Acute stroke and recently diagnosed atrial  fibrillation.   HISTORY OF PRESENT ILLNESS:  The patient is a pleasant 77 year old male with  a history of type 2 diabetes mellitus, hypertension, dyslipidemia, and  reportedly longstanding history of intermittent palpations without firm  diagnosis. He is now admitted to the hospital with weakness and dysarthria  and has been diagnosed with a right brain stroke with some petechial  hemorrhage, by CT scan and MRI. He was noted on presentation to be in atrial  fibrillation and has remained so during observation.  The actual onset and  duration of this is not clear as he is not symptomatic with palpitations at  present.  He has had no chest discomfort or dyspnea.  He cares for his  invalid wife at home.  He has not been on Coumadin and at present is on  heparin.  He had a two-dimensional echocardiogram today, the results of  which are pending at present. We have been asked to evaluate further.   ALLERGIES:  PENICILLIN.   MEDICATIONS:  Prior to arrival; Tiazac, Glucophage, Prozac, Lasix, Monopril,  and Glipizide.   CURRENT MEDICATIONS:  1. Aspirin 325 mg p.o. daily.  2. Tiazac 300 mg p.o. daily.  3. Prozac 20 mg p.o. daily.  4. Lasix 40 mg p.o. daily.  5. Glucotrol 5 mg p.o. daily.  6. Heparin drip.  7. Insulin.  8. Prinivil 20 mg p.o. daily.  9. Glucophage 500 mg p.o. b.i.d.  10.      Toprol XL 25 mg p.o. daily.  11.       Reglan 10 mg p.o. p.r.n.   PAST MEDICAL HISTORY:  1. Type 2 diabetes mellitus.  2. Hypertension.  3. Dyslipidemia.  4. Arthritis.   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 He has two adult children. There is no  history of tobacco or alcohol use at this time.   FAMILY HISTORY:  The patient's mother died in her 69's with liver cancer.  The patient's father died at 58 with blood clots.   REVIEW OF SYSTEMS:  As described in the history of present illness.   PHYSICAL EXAMINATION:  VITAL SIGNS: Blood pressure 145/88, heart rate 79 and  irregular.  GENERAL:  This is an obese male lying supine in no acute  distress.  HEENT:  Conjunctivae and lids normal. Oropharynx is clear.  NECK:  Supple without elevated jugular venous pressure or carotid bruits.  LUNGS:  Clear to auscultation with decreased breath sounds.  HEART:  Irregular irregular rhythm loud murmur or gallop.  EXTREMITIES:  1+ edema below the knees with 1+ pulses.  Extremities are  warm.   Chest x-ray is reported as showing cardiomegaly with minimal bibasilar  atelectasis.   12-lead electrocardiogram shows atrial fibrillation with nonspecific  changes.   Carotid Dopplers showed no internal carotid artery stenoses with antegrade  vertebral flow (technically difficult.   LABORATORY DATA:  BUN 11, creatinine 0.8, potassium 4.2, glucose 244, WBC  11.2, platelets 154, hemoglobin 16.4, hematocrit 46.8, troponin I 0.01,  total CK 111, hemoglobin A1C 10.3, total cholesterol 153, HDL 33,  triglycerides 295, LDL cholesterol 61.  TSH 2.813, INR 1.0.   IMPRESSION:  1. Atrial fibrillation of unknown duration, diagnosed concurrently with an     acute stroke as outlined. The patient does describe a history of     palpitations over the last several years. He has not been on Coumadin.     He is at present on heparin and heart rate is controlled on Toprol XL and     Tiazac.   Two-dimensional echocardiographic results are pending.  2. Normal thyroid stimulating hormone.  3. Hypertension.  4. Type 2 diabetes mellitus, poorly controlled based on hemoglobin A1C.  5. Dyslipidemia as outlined, metabolic syndrome.  6. No known history of coronary artery disease or myocardial infarction.   RECOMMENDATIONS:  1. Will follow up on two-dimensional echocardiogram results.  2. Agree with heparin and ultimately transition to a longterm Coumadin.     There is no need for acute cardioversion at this time. Would proceed with     a strategy of weight control and anticoagulation at present.  3. Would ultimately consider ischemic testing, most likely with an Adenosine     Cardiolite.                                               Jonelle Sidle, M.D. [facility]    SGM/MEDQ  D:  08/12/2002  T:  08/12/2002  Job:  [id number]

## 2010-07-06 NOTE — Discharge Summary (Signed)
[name], [name]                         ACCOUNT NO.:  [id number]   MEDICAL RECORD NO.:  [id number]                   PATIENT TYPE:  INP   LOCATION:  3007                                 FACILITY:  [facility]   PHYSICIAN:  Gustavus Messing. Orlin Hilding, M.D.          DATE OF BIRTH:  14-Mar-1932   DATE OF ADMISSION:  08/11/2002  DATE OF DISCHARGE:  08/16/2002                                 DISCHARGE SUMMARY   DISCHARGE DIAGNOSES:  1. Right frontoparietal embolic infarction with mild left face and arm     weakness secondary to atrial fibrillation.  2. New onset atrial fibrillation.  3. Type 2 diabetes.  4. Elevated CK-MB's.  5. Hypertension.  6. Hyperlipidemia.  7. Obesity with weight of greater then 300 pounds x30 years.  8. History of ascites.  9. Mild anemia history.  10.      Depression.  11.      Chronic nausea.  12.      Metabolic syndrome.   DISCHARGE MEDICATIONS:  1. Glucophage 500 mg b.i.d. with meals.  2. Glucotrol 5 mg q.a.m.  3. Monopril 20 mg daily.  4. Lasix 40 mg daily.  5. Prozac 20 mg daily.  6. Lantus 14 units at bedtime.  7. Lovenox 100 mg q.12h. x5 days.  8. Mag sulfate one daily.  9. Coumadin 7.5 mg daily.  10.      Lopressor 25 mg t.i.d.  11.      Cardizem CD 180 mg daily.  12.      Aspirin 81 mg daily.   DIET:  Low fat, low salt diabetic, regular consistency, thin liquids.   STUDIES PERFORMED:  1. CT scan of the head, negative for acute ischemia on admission.  He has     prominent frontal and lateral ventricles without associated dilation of     the temporal horns.  Central atrophy present.  Paraventricular low     density white matter disease.  2. MRI of the brain showed an acute subacute infarction in the posterior     aspect of the right parietal lobe MCA branch.  Petechial hemorrhage is     noted.  Soft tissue swelling of the mastoid process is noted essential to     the odontoid process at the C1-2 articulation.  Etiology unknown.  May be     a  form of __________and arthropexy due to degenerative changes.  3. MRA of the brain shows large vessel is widely patent.  Some intracranial     atherosclerotic changes in the medium sized branches.  4. Chest x-ray shows cardiomegaly, minimal bibasilar atelectasis.  5. Carotid Doppler shows no arterial stenosis bilaterally.  Vertebral artery     flow antegrade bilaterally.  6. A 2-D echocardiogram reveals an ejection fraction of 50 to 55%.     Inadequate to left ventricular thrombus, left atrium moderately dilated.     Inadequate to exclude left atrial thrombus.  7. EKG shows atrial fibrillation.   LABORATORY DATA:  INR on day of discharge is 1.2, with platelets of 171, and  hemoglobin 14.6.  White blood cell count elevated at 11.2 during  hospitalization, down to 8.5 at discharge.  Differential within normal  limits.  Chemistries showed a sodium of 139, potassium 4.2, chloride 102,  CO2 29, glucose 244, BUN 11, creatinine 0.8, calcium 9.4, total protein 6.9,  albumin 3.9, AST 21, ALT 21, ALP 47, total bilirubin 1.  Hemoglobin A1C  elevated at 10.3.  Homocystine 12.44.  CK 124 and 111, CK-MB 12.1 and 8.4,  troponin 0.01 x2.  Lipid profile with cholesterol of 153, triglycerides 295,  HDL 33, and LDL 61.  TSH 2.813.  Serum drug screen negative.  Urinalysis  with 15 ketones, otherwise negative.   HISTORY OF PRESENT ILLNESS:  [name] is a 77 year old right-handed  white male with a history of hypertension, diabetes, obesity, and  dyslipidemia with a history of long-standing palpitations without diagnosis  who developed sudden onset of slurred speech at 2:30 the day of admission  accompanied by left arm weakness and numbness and a facial droop.  His wife  called the daughter who then called EMS.  He arrived at [facility]  within three hour onset.  A CT scan was negative for acute stroke, and a  code stroke was called.  The patient did not receive TPA secondary to rapid   improvement with a NIH stroke scale of 2.   HOSPITAL COURSE:  MRI did reveal acute infarction in the right parietal with  petechial hemorrhage, and an old left occipital infarct.  The patient's EKG  revealed atrial fibrillation and the patient was started on IV heparin.  It  was felt the initial hemorrhage was petechial and would be unaffected by IV  heparin.  Cardiology was consulted who agreed with the diagnosis of new  onset atrial fibrillation with an unknown duration with treatment with long-  term Coumadin best option.  __________elevated CK-MB's, that a cardiac  evaluation should be done as an outpatient with a Cardiolite and/or cardiac  catheterization.   Rapid improvement negated the need to followup on rehabilitation consult.  That was obtained.  The patient was found to be independent of physical  therapy with some outpatient occupational therapy for left hand weakness.   Conversion from IV heparin to Coumadin continued during hospitalization.  The patient then lost all IV access and had to be started on Lovenox for  this conversion.  With the patient receiving Lovenox, thought this could be  done safely at home, and the patient was discharged on Lovenox as bridge to  Coumadin.  He was unable to afford Lovenox, and Lovenox was provided through  the drug company.  The patient will be followed by the [facility].  Will have home health nurse initially do diabetes education as well as  Lovenox and blood draws because the patient and his primary caregiver are  both bed bound wife and cannot leave her.   CONDITION ON DISCHARGE:  The patient is alert and oriented x3.  Speech is  clear.  Mild left facial weakness.  No aphasia.  Left arm drift.  Left arm  4+/5 proximally and 3/5 distally.  Left lower extremity normal.  Right  strength normal.  Chest clear to auscultation.  Heart regular irregular. Sensation intact.  He is clumsy.  Left upper extremity with finger-nose-   finger.   DISCHARGE PLAN:  1. Discharged  home.  Will be followed.  2. Lovenox to Coumadin bridge.  Will be monitored with blood drawn by home     health company with labs called to [facility] for     adjustments.  3. Home health R.N. to follow up with diabetes education, will plan for     outpatient diabetes education when the patient is more mobile.  4. Outpatient Cardiolite/cath by [facility] in future.  Will follow     up with them within four weeks.  5. Home health occupational therapy.  6. Follow up with Dr. Vickey Huger in four weeks.     Annie Main, N.P.                         Catherine A. Orlin Hilding, M.D.    SB/MEDQ  D:  08/17/2002  T:  08/17/2002  Job:  [id number]   cc:   Melvyn Novas, M.D.  [address]  [address]  [place]  [place] [address]  Fax: [phone]   Rosalyn Gess. Norins, M.D. [facility]    cc:   Melvyn Novas, M.D.  [address]  [address]  [place]  [place] [address]  Fax: [phone]   Rosalyn Gess. Norins, M.D. [facility]

## 2010-07-06 NOTE — H&P (Signed)
NAME:  PERLE, GIBBON                         ACCOUNT NO.:  [id number]   MEDICAL RECORD NO.:  [id number]                   PATIENT TYPE:  EMS   LOCATION:  MAJO                                 FACILITY:  [facility]   PHYSICIAN:  Melvyn Novas, M.D.               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 today developed slurred speech, left arm weakness and numbness and a  facial droop. His wife called the daughter by phone, and the daughter then  called the EMS to the hospital. All this occurred around 2:30 to 2:45 a.m.  today. The patient then arrived at [facility] at around 4-4:15  after onset of symptoms. Here, a CT scan was obtained and a code stroke  called.   PAST MEDICAL HISTORY:  1. Diabetes mellitus.  2. Hypertension.  3. Hypercholesterolemia.  4. Ascites.  5. Long standing obesity. The patient states he has kept his weight of 300     pounds for almost 30 years.   MEDICATIONS:  1. Tiazac 20 mg q.d.  2. Metformin 500 mg b.i.d.  3. Prozac 20 mg q.d.  4. Lasix 40 mg q.d.  5. Monopril 20 mg q.d.  6. Glipizide 5 mg q.d.   ALLERGIES:  No known drug allergies.   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 He has two adult children. He is a nonsmoker, nondrinker, and never  has smoked or drunk.   FAMILY HISTORY:  Positive for coronary artery disease, diabetes mellitus,  obesity, and cholesterolemia.   LABORATORY AND ACCESSORY DATA:  A CT of the head was negative for acute  bleed but shows atrophy with dilated ventricles out of proportion to the  cortical atrophy. An EKG shows atrial fibrillation, apparently new onset as  the patient is not aware of this disorder.   Normal sodium  139, potassium 4.2, glucose 244. White blood cell count was  normal at 7.1, red blood cells at 4.9, hemoglobin and hematocrit within  normal limits. The patient had a normal neutrophil count but elevated  eosinophil count at 6%. AST and ALT were 21 each, bilirubin 1.0, calcium  9.4.   PHYSICAL EXAMINATION:  VITAL SIGNS:  Blood pressure 149/78 by right  monitoring in the reclined position.  LUNGS:  Clear to auscultation.  COR:  Irregular rate and rhythm. No murmur.  ABDOMEN:  Obese, soft, nontender.  EXTREMITIES:  Edema is pitting, 2+, in both lower extremities, apparently  long standing. The patient denies a history of congestive heart failure.  NEUROLOGICAL:  Mental status:  Awake and oriented, fluent speech, no ataxia.  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 There is decreased nasolabial fold on the left and  the inability to close the left eye. There is also facial numbness on the  left in the same distribution, not involving the forehead. Tongue is not  deviated. Uvula is low but midline. Neck is supple. ______ are not  lateralized. Full extraocular movements also including that the patient has  no nystagmus. Motor exam shows pronator drift on the left, Babinski on the  left. Heel/shin was bilaterally intact. Finger/nose was slightly slower on  the left but not ataxia. Gait was deferred.   ASSESSMENT:  Suspect ischemic stroke in the middle cerebral artery  distribution, subcortical weakness and numbness suggesting that this is a  deep white matter stroke. Rule out embolism due to new onset atrial  fibrillation. Rule out vasothrombotic event, and the patient has underlying  risk factors of hypercholesterolemia, hypoglycemia, and hypertension.   PLAN:  The plan is to obtain MRI/MRA, telemetry for tonight. PT  consultation. The patient can have a  regular diet. CK and CK-MB are pending.  A 12-lead EKG was obtained showing only atrial fibrillation but no ST  elevation. Heparin was started and we will convert Coumadin if the atrial  fibrillation persists. Primary care physician is Dr. Illene Regulus.                                               Melvyn Novas, M.D.    CD/MEDQ  D:  08/11/2002  T:  08/12/2002  Job:  [id number]

## 2010-11-07 LAB — BASIC METABOLIC PANEL
GFR calc Af Amer: >60
GFR calc non Af Amer: >60
Glucose, Bld: 238 — ABNORMAL HIGH
Sodium: 139

## 2010-11-07 LAB — DIFFERENTIAL
Basophils Absolute: 0
Basophils Relative: 0
Eosinophils Relative: 5
Lymphs Abs: 1.6
Monocytes Absolute: 0.7

## 2010-11-07 LAB — CBC
MCHC: 34.3
Platelets: 151
RDW: 17.7 — ABNORMAL HIGH
WBC: 8.6

## 2010-11-07 LAB — PROTIME-INR
INR: 2 — ABNORMAL HIGH
Prothrombin Time: 23.3 — ABNORMAL HIGH

## 2010-11-08 LAB — DIFFERENTIAL
Basophils Absolute: 0
Eosinophils Relative: 8 — ABNORMAL HIGH
Monocytes Absolute: 0.4
Monocytes Relative: 5
Neutrophils Relative %: 71

## 2010-11-08 LAB — BASIC METABOLIC PANEL
BUN: 16
CO2: 31
Chloride: 96
GFR calc Af Amer: >60

## 2010-11-08 LAB — CBC
HCT: 39
MCHC: 34.2
MCV: 84.5
RDW: 16.9 — ABNORMAL HIGH
WBC: 7.4

## 2010-11-23 LAB — COMPREHENSIVE METABOLIC PANEL
ALT: 22
Alkaline Phosphatase: 52
Glucose, Bld: 116 — ABNORMAL HIGH
Potassium: 3.7
Sodium: 135
Total Protein: 6.6

## 2010-11-23 LAB — CBC
HCT: 41
Hemoglobin: 13.2
MCV: 83.4
Platelets: 178
RBC: 4.61
RDW: 17.4 — ABNORMAL HIGH
RDW: 18.5 — ABNORMAL HIGH

## 2010-11-23 LAB — B-NATRIURETIC PEPTIDE (CONVERTED LAB): Pro B Natriuretic peptide (BNP): 169 — ABNORMAL HIGH

## 2010-11-23 LAB — URINALYSIS, ROUTINE W REFLEX MICROSCOPIC
Glucose, UA: NEGATIVE
Ketones, ur: NEGATIVE
Protein, ur: 30 — AB

## 2010-11-23 LAB — POCT I-STAT 3, ART BLOOD GAS (G3+)
Bicarbonate: 35.8 — ABNORMAL HIGH
Operator id: 145191
Patient temperature: 100
pCO2 arterial: 47.6 — ABNORMAL HIGH
pH, Arterial: 7.486 — ABNORMAL HIGH
pO2, Arterial: 108 — ABNORMAL HIGH

## 2010-11-23 LAB — DIFFERENTIAL
Basophils Absolute: 0
Lymphs Abs: 0.8
Monocytes Absolute: 1.3 — ABNORMAL HIGH
Monocytes Relative: 5
Neutrophils Relative %: 92 — ABNORMAL HIGH
WBC Morphology: INCREASED

## 2010-11-23 LAB — BASIC METABOLIC PANEL: Calcium: 9.2

## 2010-11-23 LAB — VITAMIN B12: Vitamin B-12: 392 (ref 211–911)

## 2010-11-23 LAB — TSH: TSH: 1.669

## 2010-11-23 LAB — T4, FREE: Free T4: 0.71 — ABNORMAL LOW

## 2010-11-23 LAB — AMMONIA: Ammonia: 63 — ABNORMAL HIGH

## 2010-11-28 LAB — BASIC METABOLIC PANEL
BUN: 17
BUN: 23
BUN: 56 — ABNORMAL HIGH
CO2: 37 — ABNORMAL HIGH
CO2: 38 — ABNORMAL HIGH
CO2: 40 — ABNORMAL HIGH
Calcium: 8.9
Calcium: 9.1
Calcium: 9.2
Chloride: 86 — ABNORMAL LOW
Chloride: 87 — ABNORMAL LOW
Chloride: 89 — ABNORMAL LOW
Chloride: 89 — ABNORMAL LOW
Creatinine, Ser: 1.03
Creatinine, Ser: 1.05
Creatinine, Ser: 1.28
GFR calc Af Amer: 31 — ABNORMAL LOW
GFR calc Af Amer: 41 — ABNORMAL LOW
GFR calc Af Amer: >60
GFR calc Af Amer: >60
GFR calc non Af Amer: 42 — ABNORMAL LOW
GFR calc non Af Amer: >60
GFR calc non Af Amer: >60
GFR calc non Af Amer: >60
Glucose, Bld: 148 — ABNORMAL HIGH
Potassium: 3.5
Potassium: 4
Potassium: 4.9
Sodium: 134 — ABNORMAL LOW
Sodium: 138
Sodium: 138
Sodium: 140

## 2010-11-28 LAB — CBC
Hemoglobin: 11.9 — ABNORMAL LOW
RBC: 4.14 — ABNORMAL LOW
WBC: 8

## 2010-11-28 LAB — HEMOGLOBIN A1C: Mean Plasma Glucose: 161

## 2010-11-28 LAB — B-NATRIURETIC PEPTIDE (CONVERTED LAB)
Pro B Natriuretic peptide (BNP): 107 — ABNORMAL HIGH
Pro B Natriuretic peptide (BNP): 109 — ABNORMAL HIGH
Pro B Natriuretic peptide (BNP): 169 — ABNORMAL HIGH

## 2010-11-28 LAB — PROTIME-INR
INR: 1.4
INR: 1.6 — ABNORMAL HIGH
INR: 3.7 — ABNORMAL HIGH
Prothrombin Time: 17.7 — ABNORMAL HIGH
Prothrombin Time: 21.6 — ABNORMAL HIGH
Prothrombin Time: 31.4 — ABNORMAL HIGH

## 2010-11-28 LAB — OCCULT BLOOD X 1 CARD TO LAB, STOOL: Fecal Occult Bld: NEGATIVE

## 2010-11-29 LAB — URINALYSIS, ROUTINE W REFLEX MICROSCOPIC
Glucose, UA: NEGATIVE
Specific Gravity, Urine: 1.01
pH: 5

## 2010-11-29 LAB — BLOOD GAS, ARTERIAL
Acid-Base Excess: 11.8 — ABNORMAL HIGH
Delivery systems: POSITIVE
Drawn by: 272291
FIO2: 0.4
PEEP: 5
Patient temperature: 98.6
TCO2: 38.1
pCO2 arterial: 53.7 — ABNORMAL HIGH
pH, Arterial: 7.446

## 2010-11-29 LAB — BASIC METABOLIC PANEL
BUN: 25 — ABNORMAL HIGH
BUN: 31 — ABNORMAL HIGH
CO2: 37 — ABNORMAL HIGH
CO2: 43 — ABNORMAL HIGH
Calcium: 8.6
Calcium: 8.6
Calcium: 8.6
Chloride: 88 — ABNORMAL LOW
Chloride: 89 — ABNORMAL LOW
Creatinine, Ser: 1.06
Creatinine, Ser: 1.09
Creatinine, Ser: 1.22
GFR calc Af Amer: >60
GFR calc Af Amer: >60
GFR calc Af Amer: >60
GFR calc non Af Amer: >60
GFR calc non Af Amer: >60
GFR calc non Af Amer: >60
GFR calc non Af Amer: >60
Glucose, Bld: 83
Potassium: 3.4 — ABNORMAL LOW
Potassium: 3.4 — ABNORMAL LOW
Potassium: 3.9
Sodium: 138
Sodium: 139

## 2010-11-29 LAB — PROTIME-INR
INR: 2.1 — ABNORMAL HIGH
INR: 2.1 — ABNORMAL HIGH
INR: 2.3 — ABNORMAL HIGH
INR: 2.4 — ABNORMAL HIGH
INR: 2.5 — ABNORMAL HIGH
Prothrombin Time: 22.2 — ABNORMAL HIGH
Prothrombin Time: 24.2 — ABNORMAL HIGH
Prothrombin Time: 24.5 — ABNORMAL HIGH
Prothrombin Time: 27.5 — ABNORMAL HIGH
Prothrombin Time: 31.9 — ABNORMAL HIGH

## 2010-11-29 LAB — I-STAT 8, (EC8 V) (CONVERTED LAB)
BUN: 17
Bicarbonate: 37.4 — ABNORMAL HIGH
Glucose, Bld: 113 — ABNORMAL HIGH
Hemoglobin: 13.6
Potassium: 4.3
Sodium: 137
TCO2: 39
pH, Ven: 7.492 — ABNORMAL HIGH

## 2010-11-29 LAB — CBC
HCT: 33.8 — ABNORMAL LOW
HCT: 36.8 — ABNORMAL LOW
HCT: 37.1 — ABNORMAL LOW
Hemoglobin: 11.1 — ABNORMAL LOW
Hemoglobin: 12.1 — ABNORMAL LOW
Hemoglobin: 12.4 — ABNORMAL LOW
Hemoglobin: 12.5 — ABNORMAL LOW
MCHC: 33.3
MCHC: 32.6
MCHC: 32.8
MCHC: 32.8
MCHC: 33.9
MCV: 86
MCV: 86.9
MCV: 87
MCV: 87.2
Platelets: 189
Platelets: 158
Platelets: 160
Platelets: 164
Platelets: 214
RBC: 4.21 — ABNORMAL LOW
RBC: 4.27
RDW: 16.8 — ABNORMAL HIGH
RDW: 17.2 — ABNORMAL HIGH
RDW: 17.3 — ABNORMAL HIGH
WBC: 7.1
WBC: 10.2
WBC: 5.8

## 2010-11-29 LAB — DIFFERENTIAL
Eosinophils Absolute: 0.4
Eosinophils Relative: 5
Lymphocytes Relative: 16
Lymphs Abs: 1.2
Monocytes Absolute: 0.6
Monocytes Relative: 8

## 2010-11-29 LAB — COMPREHENSIVE METABOLIC PANEL
ALT: 36
ALT: 14
AST: 25
Albumin: 3 — ABNORMAL LOW
Albumin: 2.8 — ABNORMAL LOW
Albumin: 2.9 — ABNORMAL LOW
Alkaline Phosphatase: 38 — ABNORMAL LOW
BUN: 17
CO2: 42 — ABNORMAL HIGH
Calcium: 9.4
Calcium: 8.6
Creatinine, Ser: 1.25
GFR calc Af Amer: >60
GFR calc non Af Amer: >60
Glucose, Bld: 254 — ABNORMAL HIGH
Glucose, Bld: 84
Sodium: 142
Sodium: 140
Total Bilirubin: 1.1
Total Protein: 6.4
Total Protein: 6.5

## 2010-11-29 LAB — URINE CULTURE

## 2010-11-29 LAB — HEMOGLOBIN A1C: Mean Plasma Glucose: 175

## 2010-11-29 LAB — POCT CARDIAC MARKERS
Myoglobin, poc: 203
Operator id: 151321
Troponin i, poc: <0.05

## 2010-11-29 LAB — CARDIAC PANEL(CRET KIN+CKTOT+MB+TROPI)
Relative Index: 0.7
Troponin I: 0.06
Troponin I: 0.06

## 2010-11-29 LAB — TROPONIN I: Troponin I: 0.04

## 2010-11-29 LAB — CK TOTAL AND CKMB (NOT AT ARMC): Total CK: 49

## 2010-11-29 LAB — URINE MICROSCOPIC-ADD ON

## 2010-11-29 LAB — TSH
TSH: 2.409
TSH: 1.511

## 2010-11-29 LAB — POCT I-STAT CREATININE: Operator id: 151321

## 2010-11-29 LAB — B-NATRIURETIC PEPTIDE (CONVERTED LAB)
Pro B Natriuretic peptide (BNP): 129 — ABNORMAL HIGH
Pro B Natriuretic peptide (BNP): 337 — ABNORMAL HIGH

## 2010-12-03 LAB — I-STAT 8, (EC8 V) (CONVERTED LAB)
Acid-Base Excess: 7 — ABNORMAL HIGH
BUN: 43 — ABNORMAL HIGH
Bicarbonate: 34.6 — ABNORMAL HIGH
Chloride: 102
Glucose, Bld: 94
HCT: 37 — ABNORMAL LOW
Hemoglobin: 12.6 — ABNORMAL LOW
Operator id: 277751
Potassium: 5
Sodium: 137
TCO2: 36
pCO2, Ven: 63.6 — ABNORMAL HIGH
pH, Ven: 7.343 — ABNORMAL HIGH

## 2010-12-03 LAB — URINALYSIS, ROUTINE W REFLEX MICROSCOPIC
Bilirubin Urine: NEGATIVE
Glucose, UA: NEGATIVE
Ketones, ur: NEGATIVE
Leukocytes, UA: NEGATIVE
Nitrite: NEGATIVE
Protein, ur: 30 — AB
Specific Gravity, Urine: 1.014
Urobilinogen, UA: 0.2
pH: 6

## 2010-12-03 LAB — PROTIME-INR
INR: 1.4
INR: 1.7 — ABNORMAL HIGH
INR: 2 — ABNORMAL HIGH
INR: 2.5 — ABNORMAL HIGH
Prothrombin Time: 17.4 — ABNORMAL HIGH
Prothrombin Time: 20.2 — ABNORMAL HIGH
Prothrombin Time: 23.4 — ABNORMAL HIGH
Prothrombin Time: 27.4 — ABNORMAL HIGH

## 2010-12-03 LAB — COMPREHENSIVE METABOLIC PANEL
CO2: 30
Calcium: 8.5
Creatinine, Ser: 1.41
GFR calc non Af Amer: 50 — ABNORMAL LOW
Glucose, Bld: 102 — ABNORMAL HIGH

## 2010-12-03 LAB — BASIC METABOLIC PANEL
BUN: 29 — ABNORMAL HIGH
CO2: 33 — ABNORMAL HIGH
Chloride: 101
Creatinine, Ser: 1.21

## 2010-12-03 LAB — CBC
HCT: 31.2 — ABNORMAL LOW
HCT: 33.8 — ABNORMAL LOW
Hemoglobin: 10.6 — ABNORMAL LOW
Hemoglobin: 11.3 — ABNORMAL LOW
MCHC: 33.4
MCHC: 33.8
MCHC: 34
MCV: 88
MCV: 89.1
MCV: 89.2
Platelets: 180
Platelets: 184
Platelets: 228
RBC: 3.55 — ABNORMAL LOW
RBC: 3.78 — ABNORMAL LOW
RDW: 15.4 — ABNORMAL HIGH
RDW: 15.6 — ABNORMAL HIGH
WBC: 6.5
WBC: 7.8
WBC: 8.6

## 2010-12-03 LAB — BASIC METABOLIC PANEL WITH GFR
BUN: 33 — ABNORMAL HIGH
CO2: 31
Calcium: 8.7
Chloride: 101
Creatinine, Ser: 1.18
GFR calc non Af Amer: >60
Glucose, Bld: 96
Potassium: 4.3
Sodium: 139

## 2010-12-03 LAB — DIFFERENTIAL
Basophils Absolute: 0
Basophils Absolute: 0
Basophils Absolute: 0
Basophils Relative: 1
Basophils Relative: 1
Eosinophils Absolute: 0.3
Eosinophils Absolute: 0.4
Eosinophils Relative: 4
Eosinophils Relative: 5
Lymphocytes Relative: 24
Lymphocytes Relative: 24
Lymphs Abs: 1.5
Monocytes Absolute: 0.6
Monocytes Absolute: 0.9 — ABNORMAL HIGH
Monocytes Relative: 9
Neutro Abs: 4
Neutrophils Relative %: 60
Neutrophils Relative %: 62

## 2010-12-03 LAB — D-DIMER, QUANTITATIVE

## 2010-12-03 LAB — POCT I-STAT CREATININE
Creatinine, Ser: 1.5
Operator id: 277751

## 2010-12-03 LAB — URINE MICROSCOPIC-ADD ON

## 2010-12-03 LAB — CARDIAC PANEL(CRET KIN+CKTOT+MB+TROPI)
CK, MB: 2.5
Relative Index: 1.9
Relative Index: 2.1
Total CK: 114
Total CK: 131
Total CK: 145
Troponin I: 0.04
Troponin I: 0.05

## 2010-12-03 LAB — CULTURE, BLOOD (ROUTINE X 2): Culture: NO GROWTH

## 2010-12-03 LAB — MAGNESIUM: Magnesium: 2.5

## 2010-12-03 LAB — POCT CARDIAC MARKERS
CKMB, poc: 1.8
Myoglobin, poc: 251
Operator id: 277751
Troponin i, poc: <0.05

## 2010-12-03 LAB — APTT

## 2010-12-03 LAB — PHOSPHORUS: Phosphorus: 3.7

## 2010-12-03 LAB — HEMOGLOBIN A1C: Hgb A1c MFr Bld: 8.2 — ABNORMAL HIGH

## 2010-12-03 LAB — B-NATRIURETIC PEPTIDE (CONVERTED LAB): Pro B Natriuretic peptide (BNP): 215 — ABNORMAL HIGH

## 2011-01-13 ENCOUNTER — Ambulatory Visit: Payer: Medicare Other

## 2011-02-01 ENCOUNTER — Encounter: Payer: Self-pay | Admitting: Emergency Medicine

## 2011-02-01 ENCOUNTER — Other Ambulatory Visit: Payer: Self-pay

## 2011-02-01 ENCOUNTER — Inpatient Hospital Stay (HOSPITAL_COMMUNITY)
Admission: EM | Admit: 2011-02-01 | Discharge: 2011-02-05 | DRG: 057 | Disposition: A | Discharge status: Skilled Nursing Facility | Payer: Medicare Other | Source: Ambulatory Visit | Attending: Internal Medicine | Admitting: Internal Medicine

## 2011-02-01 ENCOUNTER — Emergency Department (HOSPITAL_COMMUNITY): Payer: Medicare Other

## 2011-02-01 DIAGNOSIS — Z86718 Personal history of other venous thrombosis and embolism: Secondary | ICD-10-CM

## 2011-02-01 DIAGNOSIS — L89109 Pressure ulcer of unspecified part of back, unspecified stage: Secondary | ICD-10-CM | POA: Diagnosis present

## 2011-02-01 DIAGNOSIS — I639 Cerebral infarction, unspecified: Secondary | ICD-10-CM | POA: Diagnosis present

## 2011-02-01 DIAGNOSIS — Z8673 Personal history of transient ischemic attack (TIA), and cerebral infarction without residual deficits: Secondary | ICD-10-CM

## 2011-02-01 DIAGNOSIS — I5032 Chronic diastolic (congestive) heart failure: Secondary | ICD-10-CM | POA: Diagnosis present

## 2011-02-01 DIAGNOSIS — G250 Essential tremor: Secondary | ICD-10-CM | POA: Diagnosis present

## 2011-02-01 DIAGNOSIS — F0280 Dementia in other diseases classified elsewhere without behavioral disturbance: Principal | ICD-10-CM | POA: Diagnosis present

## 2011-02-01 DIAGNOSIS — Z79899 Other long term (current) drug therapy: Secondary | ICD-10-CM

## 2011-02-01 DIAGNOSIS — Z87891 Personal history of nicotine dependence: Secondary | ICD-10-CM

## 2011-02-01 DIAGNOSIS — R627 Adult failure to thrive: Secondary | ICD-10-CM | POA: Diagnosis present

## 2011-02-01 DIAGNOSIS — I4891 Unspecified atrial fibrillation: Secondary | ICD-10-CM | POA: Diagnosis present

## 2011-02-01 DIAGNOSIS — I509 Heart failure, unspecified: Secondary | ICD-10-CM | POA: Diagnosis present

## 2011-02-01 DIAGNOSIS — I10 Essential (primary) hypertension: Secondary | ICD-10-CM | POA: Diagnosis present

## 2011-02-01 DIAGNOSIS — G309 Alzheimer's disease, unspecified: Principal | ICD-10-CM | POA: Diagnosis present

## 2011-02-01 DIAGNOSIS — L8991 Pressure ulcer of unspecified site, stage 1: Secondary | ICD-10-CM | POA: Diagnosis present

## 2011-02-01 DIAGNOSIS — Z7401 Bed confinement status: Secondary | ICD-10-CM

## 2011-02-01 DIAGNOSIS — E875 Hyperkalemia: Secondary | ICD-10-CM | POA: Diagnosis present

## 2011-02-01 DIAGNOSIS — E119 Type 2 diabetes mellitus without complications: Secondary | ICD-10-CM | POA: Diagnosis present

## 2011-02-01 DIAGNOSIS — Z66 Do not resuscitate: Secondary | ICD-10-CM | POA: Diagnosis present

## 2011-02-01 DIAGNOSIS — Z880 Allergy status to penicillin: Secondary | ICD-10-CM

## 2011-02-01 DIAGNOSIS — R6251 Failure to thrive (child): Secondary | ICD-10-CM

## 2011-02-01 DIAGNOSIS — G252 Other specified forms of tremor: Secondary | ICD-10-CM | POA: Diagnosis present

## 2011-02-01 DIAGNOSIS — I739 Peripheral vascular disease, unspecified: Secondary | ICD-10-CM | POA: Diagnosis present

## 2011-02-01 DIAGNOSIS — E669 Obesity, unspecified: Secondary | ICD-10-CM | POA: Diagnosis present

## 2011-02-01 DIAGNOSIS — R5381 Other malaise: Secondary | ICD-10-CM | POA: Diagnosis present

## 2011-02-01 DIAGNOSIS — Z7901 Long term (current) use of anticoagulants: Secondary | ICD-10-CM

## 2011-02-01 DIAGNOSIS — F319 Bipolar disorder, unspecified: Secondary | ICD-10-CM | POA: Diagnosis present

## 2011-02-01 DIAGNOSIS — Z794 Long term (current) use of insulin: Secondary | ICD-10-CM

## 2011-02-01 DIAGNOSIS — G4733 Obstructive sleep apnea (adult) (pediatric): Secondary | ICD-10-CM | POA: Diagnosis present

## 2011-02-01 HISTORY — DX: Cerebral infarction, unspecified: I63.9

## 2011-02-01 HISTORY — DX: Hyperlipidemia, unspecified: E78.5

## 2011-02-01 HISTORY — DX: Dementia in other diseases classified elsewhere, unspecified severity, without behavioral disturbance, psychotic disturbance, mood disturbance, and anxiety: F02.80

## 2011-02-01 HISTORY — DX: Acute embolism and thrombosis of unspecified deep veins of unspecified lower extremity: I82.409

## 2011-02-01 HISTORY — DX: Non-pressure chronic ulcer of unspecified part of unspecified lower leg with unspecified severity: L97.909

## 2011-02-01 HISTORY — DX: Peripheral vascular disease, unspecified: I73.9

## 2011-02-01 HISTORY — DX: Heart failure, unspecified: I50.9

## 2011-02-01 HISTORY — DX: Unspecified osteoarthritis, unspecified site: M19.90

## 2011-02-01 HISTORY — DX: Varicose veins of unspecified lower extremity with ulcer of unspecified site: I83.009

## 2011-02-01 HISTORY — DX: Alzheimer's disease, unspecified: G30.9

## 2011-02-01 HISTORY — DX: Bipolar disorder, unspecified: F31.9

## 2011-02-01 HISTORY — DX: Unspecified atrial fibrillation: I48.91

## 2011-02-01 HISTORY — DX: Essential (primary) hypertension: I10

## 2011-02-01 LAB — CARDIAC PANEL(CRET KIN+CKTOT+MB+TROPI)
CK, MB: 2.8 ng/mL (ref 0.3–4.0)
Total CK: 42 U/L (ref 7–232)
Total CK: 40 U/L (ref 7–232)
Troponin I: <0.3 ng/mL (ref <0.30)

## 2011-02-01 LAB — URINALYSIS, ROUTINE W REFLEX MICROSCOPIC
Bilirubin Urine: NEGATIVE
Nitrite: NEGATIVE
Specific Gravity, Urine: 1.017 (ref 1.005–1.030)
pH: 7 (ref 5.0–8.0)

## 2011-02-01 LAB — PROTIME-INR: Prothrombin Time: 14.6 seconds (ref 11.6–15.2)

## 2011-02-01 LAB — CBC
Hemoglobin: 14.2 g/dL (ref 13.0–17.0)
MCHC: 33.5 g/dL (ref 30.0–36.0)
RBC: 4.74 MIL/uL (ref 4.22–5.81)

## 2011-02-01 LAB — BASIC METABOLIC PANEL
BUN: 13 mg/dL (ref 6–23)
Chloride: 97 mEq/L (ref 96–112)
GFR calc Af Amer: >90 mL/min (ref >90)
Potassium: 5.3 mEq/L — ABNORMAL HIGH (ref 3.5–5.1)

## 2011-02-01 LAB — DIFFERENTIAL
Basophils Relative: 0 % (ref 0–1)
Lymphs Abs: 1.5 10*3/uL (ref 0.7–4.0)
Monocytes Relative: 7 % (ref 3–12)
Neutro Abs: 5 10*3/uL (ref 1.7–7.7)
Neutrophils Relative %: 69 % (ref 43–77)

## 2011-02-01 LAB — URINE MICROSCOPIC-ADD ON

## 2011-02-01 MED ORDER — MORPHINE SULFATE 4 MG/ML IJ SOLN
4.0000 mg | Freq: Once | INTRAMUSCULAR | Status: AC
  Administered 2011-02-01: 4 mg via INTRAVENOUS
  Filled 2011-02-01: qty 1

## 2011-02-01 MED ORDER — SODIUM CHLORIDE 0.9 % IJ SOLN: 3.0000 mL | INTRAMUSCULAR | Status: DC | PRN

## 2011-02-01 MED ORDER — ONDANSETRON HCL 4 MG/2ML IJ SOLN: 4.0000 mg | Freq: Three times a day (TID) | INTRAMUSCULAR | Status: DC | PRN

## 2011-02-01 MED ORDER — ONDANSETRON HCL 4 MG/2ML IJ SOLN
INTRAMUSCULAR | Status: AC
  Administered 2011-02-01: 4 mg via INTRAVENOUS
  Filled 2011-02-01: qty 2

## 2011-02-01 MED ORDER — SODIUM POLYSTYRENE SULFONATE 15 GM/60ML PO SUSP
30.0000 g | Freq: Once | ORAL | Status: AC
  Administered 2011-02-01: 30 g via ORAL
  Filled 2011-02-01: qty 120

## 2011-02-01 MED ORDER — SODIUM CHLORIDE 0.9 % IV SOLN: 250.0000 mL | INTRAVENOUS | Status: DC | PRN

## 2011-02-01 MED ORDER — ONDANSETRON HCL 4 MG/2ML IJ SOLN
4.0000 mg | Freq: Once | INTRAMUSCULAR | Status: AC
  Administered 2011-02-01: 4 mg via INTRAVENOUS

## 2011-02-01 MED ORDER — SODIUM CHLORIDE 0.9 % IV SOLN
INTRAVENOUS | Status: AC
  Administered 2011-02-01: 22:00:00 via INTRAVENOUS

## 2011-02-01 MED ORDER — ACETAMINOPHEN 650 MG RE SUPP: 650.0000 mg | Freq: Four times a day (QID) | RECTAL | Status: DC | PRN

## 2011-02-01 MED ORDER — SODIUM CHLORIDE 0.9 % IJ SOLN
3.0000 mL | Freq: Two times a day (BID) | INTRAMUSCULAR | Status: DC
  Administered 2011-02-02 – 2011-02-05 (×8): 3 mL via INTRAVENOUS

## 2011-02-01 MED ORDER — ACETAMINOPHEN 325 MG PO TABS
650.0000 mg | ORAL_TABLET | Freq: Four times a day (QID) | ORAL | Status: DC | PRN
  Administered 2011-02-02: 650 mg via ORAL
  Filled 2011-02-01: qty 2

## 2011-02-01 NOTE — H&P (Addendum)
PCP:  Murray Hodgkins per the daughter. Pt lives at [facility].   Chief Complaint:  Weakness, disorientation, confusion  HPI: 78yoM with h/o severe sleep apnea, right sided CVA 2004 currently bedbound, chronic  AFib/Coumadin, right sided CHF, mild dementia.   Per history from daughter and son at bedside, the patient had a right sided CVA in 2004  and since then did recover a little, but overall has been bed bound for the past 3 yrs.  However his baseline up until a month ago was mental sharpness and ability to joke  around, knew where he was and the name of the facility, and knew that his wife had  passed away. Not able to fully feed himself but could drink out of a cup by himself.  Could have meaningful conversation. For the past 2-4 weeks however, he's had an acute  decline in his mental status. He appears really lethargic, doesn't participate in  conversation, and is very weak. He's too weak to even pick up a phone which he  previously could do. He can't answer the phone, he can't hold a cup to drink. He's more  disoriented, asking inappropriate questions like if his room was changed, doesn't know  where he is. Two weeks ago a chest x-ray showed bibasilar opacities for which he got  Levofloxacin, however the pt also is noted to have dysphagia and possibly aspirating.   In the ED vitals were stable. Chem with K 5.3, renal 13/0.73, glucose 208. Trop  negative x2. CBC normal, Hct 42.4. Ddimer negative. UA negative other than 15  ketonuria, 30 proteinuria. CT head showed small vessel disease, brain atrophy,  persistent ventriculomegaly, cannot r/o NPH, and unchanged right frontal lobe  encephalomalacia. CXR with low lung volumes, no acute findings. Pt was given morphine,  zofran, kayexalate.   ROS as above, otherwise with sweats but no fevers, chills. He had some eye drops for  eye crusting. No chest pain, SOB, respiratory distress, n/v/abd pain. No weight  changes, dizziness, LH. ROS  o/w negative.  Past Medical History  Diagnosis Date  . Hypertension   . CHF (congestive heart failure)     H/o right sided CHF   . Arthritis   . Alzheimer's dementia   . Bipolar 1 disorder   . PVD (peripheral vascular disease)   . DVT (deep venous thrombosis)   . Atrial fibrillation   . CVA (cerebral infarction) 2004    right sided  . Dyslipidemia   . Diabetes mellitus   . Venous stasis ulcers     History reviewed. No pertinent past surgical history.  Medications:  HOME MEDS:  Reconciled with list from [facility]  Prior to Admission medications   Medication Sig Start Date End Date Taking? Authorizing Provider  ALPRAZolam (XANAX) 0.25 MG tablet Take 0.25 mg by mouth at bedtime. HOLD FOR SEDATION    Yes Historical Provider, MD  ARIPiprazole (ABILIFY) 15 MG tablet Take 15 mg by mouth daily.     Yes Historical Provider, MD  carboxymethylcellulose (REFRESH TEARS) 0.5 % SOLN Place 2 drops into both eyes every 2 (two) hours as needed. FOR DRY EYES    Yes Historical Provider, MD  carvedilol (COREG) 12.5 MG tablet Take 12.5 mg by mouth daily. TAKE WITH FOOD    Yes Historical Provider, MD  divalproex (DEPAKOTE ER) 500 MG 24 hr tablet Take 500 mg by mouth daily.     Yes Historical Provider, MD  donepezil (ARICEPT) 10 MG tablet Take 10 mg by  mouth at bedtime.     Yes Historical Provider, MD  DULoxetine (CYMBALTA) 60 MG capsule Take 120 mg by mouth daily.     Yes Historical Provider, MD  furosemide (LASIX) 20 MG tablet Take 20 mg by mouth every evening. TAKE AT 1700 DAILY    Yes Historical Provider, MD  furosemide (LASIX) 40 MG tablet Take 40 mg by mouth every morning.     Yes Historical Provider, MD  insulin detemir (LEVEMIR) 100 UNIT/ML injection Inject 50 Units into the skin at bedtime.     Yes Historical Provider, MD  lisinopril (PRINIVIL,ZESTRIL) 5 MG tablet Take 5 mg by mouth daily.     Yes Historical Provider, MD  metFORMIN (GLUCOPHAGE-XR) 500 MG 24 hr tablet Take 500 mg by mouth 2  (two) times daily with a meal.     Yes Historical Provider, MD  omeprazole (PRILOSEC) 20 MG capsule Take 20 mg by mouth daily.     Yes Historical Provider, MD  oxyCODONE-acetaminophen (PERCOCET) 5-325 MG per tablet Take 1-2 tablets by mouth See admin instructions. TAKE 1 TABLET EVERY NIGHT. MAY TAKE 1 TABLET EVERY 4 HOURS AS NEEDED FOR MODERATE PAIN. TAKE 2 TABS EVERY 4 HOURS AS NEEDED FOR SEVERE PAIN.    Yes Historical Provider, MD  senna-docusate (SENOKOT-S) 8.6-50 MG per tablet Take 1 tablet by mouth 2 (two) times daily.     Yes Historical Provider, MD  warfarin (JANTOVEN) 5 MG tablet Take 5 mg by mouth daily.     Yes Historical Provider, MD  zinc oxide 20 % ointment Apply 1 application topically as needed. APPLY TO BUTTOCKS AS NEEDED FOR BLANCHABLE  REDNESS.    Yes Historical Provider, MD    Allergies:  Allergies  Allergen Reactions  . Penicillins Shortness Of Breath    Per MAR. "Throat closes up" per daughter     Social History:  Lives at [facility]. Alferd Patee daughter [phone]  reports that he has quit smoking. He does not have any smokeless tobacco history on file. He reports that he does not drink alcohol. His drug history not on file.  Family History: History reviewed. No pertinent family history.  Physical Exam: Filed Vitals:   02/01/11 1959 02/01/11 2129 02/01/11 2139 02/01/11 2149  BP: 124/95 95/62 94/73  100/48  Pulse:  110    Temp:      TempSrc:      Resp: 16   22  SpO2: 94% 94% 96% 95%   Blood pressure 100/48, pulse 110, temperature 97.8 F (36.6 C), temperature source Oral, resp. rate 22, SpO2 95.00%.  Gen: Quite large, middle aged, still fairly robust M in ED stretcher. He overall  appears hypoactive, can answer questions but quite slowly, doesn't really follow  conversation just stares off into space, however when asked questions, answers  appropriately.  HEENT: Pupils very constricted to 1mm, slightly larger on the R, mouth moist and normal  appearing,  fairly normal. Thick bushy beard. Tongue midline Neck: Supple, normal, no palpable thyromegaly  Lungs: CTAB anterolaterally, poor exam but grossly normal Heart: Minimal tachycardia but no gross murmurs, gallops Abd: Obese, but soft, non tender, non distended, normal overall Extrem: Warm, well perfusing, with palpable bilateral radial pulses, no BLE edema but  with brown hyperpigmentation changes noted. Muscle bulk still fairly robust  Neuro: Alert, fair attention, recognizes daughter and son and names them correctly,  doesn't know year, thinks he's at [facility], doesn't remember wife's name. Answers  questions very slowly. CN2-12 grossly intact,  although EOM testing was limited as he  wouldn't fully follow commands. No nystagmus. His R hand had a resting tremor. There  was no cogwheeling, although there was some minimal increased tone of his BUE's, and  after testing his arm remained clonically raised off the bed with wrists flexed. He can  raise his BUE's above his head. Doesn't move his BLE's.   Labs & Imaging Results for orders placed during the hospital encounter of 02/01/11 (from the past 48 hour(s))  CARDIAC PANEL(CRET KIN+CKTOT+MB+TROPI)     Status: Normal   Collection Time   02/01/11  3:23 PM      Component Value Range Comment   Total CK 42  7 - 232 (U/L)    CK, MB 2.8  0.3 - 4.0 (ng/mL)    Troponin I <0.30  <0.30 (ng/mL)    Relative Index RELATIVE INDEX IS INVALID  0.0 - 2.5    PRO B NATRIURETIC PEPTIDE     Status: Abnormal   Collection Time   02/01/11  3:24 PM      Component Value Range Comment   Pro B Natriuretic peptide (BNP) 686.4 (*) 0 - 450 (pg/mL)   CBC     Status: Abnormal   Collection Time   02/01/11  3:34 PM      Component Value Range Comment   WBC 7.3  4.0 - 10.5 (K/uL)    RBC 4.74  4.22 - 5.81 (MIL/uL)    Hemoglobin 14.2  13.0 - 17.0 (g/dL)    HCT 04.5  40.9 - 81.1 (%)    MCV 89.5  78.0 - 100.0 (fL)    MCH 30.0  26.0 - 34.0 (pg)    MCHC 33.5  30.0 - 36.0  (g/dL)    RDW 91.4 (*) 78.2 - 15.5 (%)    Platelets 185  150 - 400 (K/uL)   DIFFERENTIAL     Status: Normal   Collection Time   02/01/11  3:34 PM      Component Value Range Comment   Neutrophils Relative 69  43 - 77 (%)    Neutro Abs 5.0  1.7 - 7.7 (K/uL)    Lymphocytes Relative 20  12 - 46 (%)    Lymphs Abs 1.5  0.7 - 4.0 (K/uL)    Monocytes Relative 7  3 - 12 (%)    Monocytes Absolute 0.5  0.1 - 1.0 (K/uL)    Eosinophils Relative 4  0 - 5 (%)    Eosinophils Absolute 0.3  0.0 - 0.7 (K/uL)    Basophils Relative 0  0 - 1 (%)    Basophils Absolute 0.0  0.0 - 0.1 (K/uL)   BASIC METABOLIC PANEL     Status: Abnormal   Collection Time   02/01/11  3:34 PM      Component Value Range Comment   Sodium 136  135 - 145 (mEq/L)    Potassium 5.3 (*) 3.5 - 5.1 (mEq/L) HEMOLYSIS AT THIS LEVEL MAY AFFECT RESULT   Chloride 97  96 - 112 (mEq/L)    CO2 30  19 - 32 (mEq/L)    Glucose, Bld 208 (*) 70 - 99 (mg/dL)    BUN 13  6 - 23 (mg/dL)    Creatinine, Ser 9.56  0.50 - 1.35 (mg/dL)    Calcium 9.4  8.4 - 10.5 (mg/dL)    GFR calc non Af Amer 88 (*) >90 (mL/min)    GFR calc Af Amer >90  >90 (mL/min)   PROTIME-INR  Status: Normal   Collection Time   02/01/11  3:34 PM      Component Value Range Comment   Prothrombin Time 14.6  11.6 - 15.2 (seconds)    INR 1.12  0.00 - 1.49    D-DIMER, QUANTITATIVE     Status: Normal   Collection Time   02/01/11  3:34 PM      Component Value Range Comment   D-Dimer, Quant 0.37  0.00 - 0.48 (ug/mL-FEU)   URINALYSIS, ROUTINE W REFLEX MICROSCOPIC     Status: Abnormal   Collection Time   02/01/11  4:52 PM      Component Value Range Comment   Color, Urine YELLOW  YELLOW     APPearance CLEAR  CLEAR     Specific Gravity, Urine 1.017  1.005 - 1.030     pH 7.0  5.0 - 8.0     Glucose, UA NEGATIVE  NEGATIVE (mg/dL)    Hgb urine dipstick NEGATIVE  NEGATIVE     Bilirubin Urine NEGATIVE  NEGATIVE     Ketones, ur 15 (*) NEGATIVE (mg/dL)    Protein, ur 30 (*) NEGATIVE  (mg/dL)    Urobilinogen, UA 0.2  0.0 - 1.0 (mg/dL)    Nitrite NEGATIVE  NEGATIVE     Leukocytes, UA TRACE (*) NEGATIVE    URINE MICROSCOPIC-ADD ON     Status: Normal   Collection Time   02/01/11  4:52 PM      Component Value Range Comment   Squamous Epithelial / LPF RARE  RARE     WBC, UA 0-2  <3 (WBC/hpf)    RBC / HPF 3-6  <3 (RBC/hpf)    Bacteria, UA RARE  RARE    CARDIAC PANEL(CRET KIN+CKTOT+MB+TROPI)     Status: Normal   Collection Time   02/01/11  6:42 PM      Component Value Range Comment   Total CK 40  7 - 232 (U/L)    CK, MB 3.0  0.3 - 4.0 (ng/mL)    Troponin I <0.30  <0.30 (ng/mL)    Relative Index RELATIVE INDEX IS INVALID  0.0 - 2.5     Ct Head Wo Contrast  02/01/2011  *RADIOLOGY REPORT*  Clinical Data: Generalized weakness for 6 hours  CT HEAD WITHOUT CONTRAST  Technique:  Contiguous axial images were obtained from the base of the skull through the vertex without contrast.  Comparison: 02/17/2007  Findings:  There is diffuse patchy low density throughout the subcortical and periventricular white matter consistent with chronic small vessel ischemic change.  There is prominence of the sulci and ventricles consistent with brain atrophy.  The ventricular volumes are increased.  These appear similar in volume to the previous exam.  Right frontal lobe encephalomalacia is identified which is also unchanged from previous exam.  There is no evidence for acute brain infarct, hemorrhage or mass.  The paranasal sinuses are clear.  The mastoid air cells are clear. The skull appears intact.  IMPRESSION:  1.  Small vessel ischemic change and brain atrophy. 2.  Persistent ventriculomegaly.  Cannot rule out normal pressure hydrocephalus.  Original Report Authenticated By: Rosealee Albee, M.D.   Dg Chest Portable 1 View  02/01/2011  *RADIOLOGY REPORT*  Clinical Data: Shortness of breath, weakness and cough.  PORTABLE CHEST - 1 VIEW  Comparison: 03/17/2007.  Findings: Shortness of breath,  weakness and cough. Trachea is midline.  Heart is enlarged, stable.  Lungs are low in volume with probable mild scarring at the  right lung base.  No pleural fluid.  IMPRESSION: Low lung volumes without acute finding.  Original Report Authenticated By: Reyes Ivan, M.D.   Last echo SUMMARY 10/2006  -  Overall left ventricular systolic function was normal. Left        ventricular ejection fraction was estimated , range being 60        % to 70 %. This study was inadequate for the evaluation of        left ventricular regional wall motion. Left ventricular        diastolic function parameters were normal.  -  Aortic valve thickness was mildly increased.  -  The left atrium was moderately dilated.  -  The right ventricle was mild to moderately dilated.  -  There was mild to moderate tricuspid valvular regurgitation.  -  The right atrium was mild to moderately dilated.  ECG: AFib with no RVR and RBBB, no change from prior   Impression Present on Admission:  .Hyperkalemia .Alzheimer's dementia .Bipolar 1 disorder .CVA (cerebral infarction) .Atrial fibrillation .CHF (congestive heart failure) .OBSTRUCTIVE SLEEP APNEA .DM .HYPERTENSION  78yoM with h/o severe sleep apnea, right sided CVA 2004 currently bedbound, chronic  AFib/Coumadin, right sided CHF, mild dementia now admitted with 1 month of progressive decline in mental status, confusion, generalized weakness, failure to thrive.    1. Failure to thrive, increased confusion/disorientation: I think this is primary  neurological. His medical w/u so far in the ED is quite unrevealing and wouldn't  explain his symptoms. He does have elevated HCO3 but this is actually better than it  was years ago, despite not being on CPAP for awhile.    He has what appears to be a right hand resting tremor. When his elbows are flexed and  extended, there is no cogwheeling but it is minimally tense. Interestingly, after arm  testing is done, his  bilateral hands remained flexed, with arms raised just off the  bed, almost clonic-like in nature. This, in combination with overall hypoactive  appearance, ? bradykinesia makes me question developing Parkinson's-like syndrome. EPIC  shows a diagnosis of Alzheimer's, but family states prior dementia was minimal to mild  and this is an acute change over the past month, making progressive Alzheimer's not  likely. Finally, he has persistent ventriculomegaly on CT head, raising possibility of NPH.  DDx would be new CVA and progressive vascular dementia, or medication effect -- on  Depakote, benzo, and a narcotic. Pt also had Levaquin for opacities on CXR (possibly  aspiration related) which can cause mental status changes in the elderly, but that was  a couple weeks ago.   - TSH, MRI head - Recommend Neuro consultation in the am   2. Likely dysphagia  - Pureed diet, swallow screen  3. Hyperkalemia: S/p kayexalate in the ED, mild. Trend.   H/o AFib: Continue home Warfarin, carvedilol  H/o CHF: continue home Lasix, Lisinopril. Appears euvolemic at present.  H/o DM: continue home levemir, hold metformin H/o dementia: Continue home aricept  H/o bipolar: Continue home Depakote for now   Regular bed, team 1  DNR, discussed with pt and family  Cammie Laural Benes daughter [phone]  Other plans as per orders.  Nainoa Woldt 02/01/2011, 11:15 PM

## 2011-02-01 NOTE — ED Notes (Signed)
Generalized weakness for 6 hours per EMS report. States nursing home staff reported this to him. Pt states poor appetite and "i just dont feel good."

## 2011-02-01 NOTE — ED Notes (Signed)
DNR bracelet placed on patient 

## 2011-02-01 NOTE — ED Notes (Signed)
Admitting MD at bedside.

## 2011-02-01 NOTE — ED Provider Notes (Signed)
History     CSN: [id number] Arrival date & time: 02/01/2011  2:53 PM   First MD Initiated Contact with Patient 02/01/11 1459      Chief Complaint  Patient presents with  . Weakness    general weakness for past 6 hours . no chest pain or shortness of breath.  sent from [facility] nursing home.    (Consider location/radiation/quality/duration/timing/severity/associated sxs/prior treatment) HPI Comments: Patient presents for his nursing home with generalized weakness, poor appetite and shortness of breath since this morning. He states he felt well when he went to bed last night. He feels like he can't catch his breath and has noticed a small cough. Denies any fevers or chest pain. It is not eating anything today because he has no appetite. Denies abdominal pain, nausea or vomiting. Denies any headache, neck pain, back pain.  There is no report of any fever. Patient is immobile from a previous CVA. He has a history of DVT in and atrial fibrillation on Coumadin. His last EF was normal in 2008.  The history is provided by the patient, the EMS personnel and the nursing home.    Past Medical History  Diagnosis Date  . Hypertension   . CHF (congestive heart failure)     H/o right sided CHF   . Arthritis   . Alzheimer's dementia   . Bipolar 1 disorder   . PVD (peripheral vascular disease)   . DVT (deep venous thrombosis)   . Atrial fibrillation   . CVA (cerebral infarction) 2004    right sided  . Dyslipidemia   . Diabetes mellitus   . Venous stasis ulcers     History reviewed. No pertinent past surgical history.  History reviewed. No pertinent family history.  History  Substance Use Topics  . Smoking status: Former Games developer  . Smokeless tobacco: Not on file  . Alcohol Use: No      Review of Systems  Constitutional: Positive for activity change, appetite change and fatigue. Negative for fever.  HENT: Negative for congestion and rhinorrhea.   Eyes: Negative for visual  disturbance.  Respiratory: Positive for cough.   Cardiovascular: Negative for chest pain.  Gastrointestinal: Negative for nausea, vomiting and abdominal pain.  Genitourinary: Negative for dysuria and hematuria.  Musculoskeletal: Negative for back pain.  Skin: Negative for rash.  Neurological: Positive for dizziness, weakness and light-headedness. Negative for headaches.    Allergies  Penicillins  Home Medications   No current outpatient prescriptions on file.  BP 98/62  Pulse 110  Temp(Src) 97.2 F (36.2 C) (Oral)  Resp 22  SpO2 92%  Physical Exam  Constitutional: He is oriented to person, place, and time. No distress.       Obese, chronically ill  HENT:  Head: Normocephalic and atraumatic.  Mouth/Throat: Oropharynx is clear and moist. No oropharyngeal exudate.  Eyes: Conjunctivae are normal. Pupils are equal, round, and reactive to light.  Neck: Normal range of motion.  Cardiovascular: Normal rate, regular rhythm and normal heart sounds.   Pulmonary/Chest: Effort normal and breath sounds normal. No respiratory distress.  Abdominal: Soft. There is no tenderness. There is no rebound and no guarding.  Musculoskeletal: Normal range of motion. He exhibits no edema and no tenderness.  Neurological: He is alert and oriented to person, place, and time. No cranial nerve deficit.       Does not move BLE.   Slight L facial droop. Resting tremor of RUE.  No appreciable rigidity.  Skin: Skin is warm.  ED Course  Procedures (including critical care time)  Labs Reviewed  CBC - Abnormal; Notable for the following:    RDW 15.6 (*)    All other components within normal limits  BASIC METABOLIC PANEL - Abnormal; Notable for the following:    Potassium 5.3 (*) HEMOLYSIS AT THIS LEVEL MAY AFFECT RESULT   Glucose, Bld 208 (*)    GFR calc non Af Amer 88 (*)    All other components within normal limits  URINALYSIS, ROUTINE W REFLEX MICROSCOPIC - Abnormal; Notable for the following:      Ketones, ur 15 (*)    Protein, ur 30 (*)    Leukocytes, UA TRACE (*)    All other components within normal limits  PRO B NATRIURETIC PEPTIDE - Abnormal; Notable for the following:    Pro B Natriuretic peptide (BNP) 686.4 (*)    All other components within normal limits  DIFFERENTIAL  CARDIAC PANEL(CRET KIN+CKTOT+MB+TROPI)  PROTIME-INR  D-DIMER, QUANTITATIVE  URINE MICROSCOPIC-ADD ON  CARDIAC PANEL(CRET KIN+CKTOT+MB+TROPI)  POCT CBG MONITORING  BASIC METABOLIC PANEL  CBC  TSH   Ct Head Wo Contrast  02/01/2011  *RADIOLOGY REPORT*  Clinical Data: Generalized weakness for 6 hours  CT HEAD WITHOUT CONTRAST  Technique:  Contiguous axial images were obtained from the base of the skull through the vertex without contrast.  Comparison: 02/17/2007  Findings:  There is diffuse patchy low density throughout the subcortical and periventricular white matter consistent with chronic small vessel ischemic change.  There is prominence of the sulci and ventricles consistent with brain atrophy.  The ventricular volumes are increased.  These appear similar in volume to the previous exam.  Right frontal lobe encephalomalacia is identified which is also unchanged from previous exam.  There is no evidence for acute brain infarct, hemorrhage or mass.  The paranasal sinuses are clear.  The mastoid air cells are clear. The skull appears intact.  IMPRESSION:  1.  Small vessel ischemic change and brain atrophy. 2.  Persistent ventriculomegaly.  Cannot rule out normal pressure hydrocephalus.  Original Report Authenticated By: Rosealee Albee, M.D.   Dg Chest Portable 1 View  02/01/2011  *RADIOLOGY REPORT*  Clinical Data: Shortness of breath, weakness and cough.  PORTABLE CHEST - 1 VIEW  Comparison: 03/17/2007.  Findings: Shortness of breath, weakness and cough. Trachea is midline.  Heart is enlarged, stable.  Lungs are low in volume with probable mild scarring at the right lung base.  No pleural fluid.  IMPRESSION:  Low lung volumes without acute finding.  Original Report Authenticated By: Reyes Ivan, M.D.     1. Failure to thrive   2. Hyperkalemia   3. CHF (congestive heart failure)   4. Atrial fibrillation   5. CVA (cerebral infarction)   6. Alzheimer's dementia   7. Bipolar 1 disorder       MDM  Generalized weakness with shortness of breath and cough. Patient's no distress and vital signs stable.   Will check UA, CXR, EKG, labs.  Metabolic and infectious workup pursued. Minor hyperkalemia noted and treated. No evidence of urinary tract infection or pneumonia. Patient states his pain is low back from lying in his bed. On examination of his low back and rectal area there is some erythema in stage I decubitus ulcer without evidence of infection. Family states he is a recurrent pilonidal cyst which I do not appreciate at this time. there is no abscess.   Date: 02/01/2011  Rate: 87  Rhythm: sinus rhythm  QRS Axis: normal  Intervals: normal  ST/T Wave abnormalities: nonspecific ST/T changes  Conduction Disutrbances:right bundle branch block  Narrative Interpretation:   Old EKG Reviewed: changes noted  No evidence of urinary tract infection, pneumonia, metabolic abnormality. Negative workup discussed with patient and family. Discussed lack of criteria meeting hospital admission. It seems patient's weakness has been going on for more than 2 weeks.  The family is very reluctant for patient to go back to nursing home. They feel he is getting worse and worse over the past 2 weeks. They state that he's been too weak to call his nurse he's been too weak to feed himself. They say he is normally awake and alert and able to provide for himself.       Glynn Octave, MD 02/02/11 [id number]

## 2011-02-02 ENCOUNTER — Observation Stay (HOSPITAL_COMMUNITY): Payer: Medicare Other

## 2011-02-02 LAB — BASIC METABOLIC PANEL
BUN: 18 mg/dL (ref 6–23)
CO2: 31 mEq/L (ref 19–32)
Chloride: 99 mEq/L (ref 96–112)
Glucose, Bld: 175 mg/dL — ABNORMAL HIGH (ref 70–99)
Potassium: 4.6 mEq/L (ref 3.5–5.1)

## 2011-02-02 LAB — CBC
HCT: 39.1 % (ref 39.0–52.0)
Hemoglobin: 12.9 g/dL — ABNORMAL LOW (ref 13.0–17.0)
MCH: 29.5 pg (ref 26.0–34.0)
MCHC: 33 g/dL (ref 30.0–36.0)

## 2011-02-02 MED ORDER — DULOXETINE HCL 60 MG PO CPEP
120.0000 mg | ORAL_CAPSULE | Freq: Every day | ORAL | Status: DC
  Administered 2011-02-02 – 2011-02-05 (×4): 120 mg via ORAL
  Filled 2011-02-02 (×4): qty 2

## 2011-02-02 MED ORDER — DIVALPROEX SODIUM ER 500 MG PO TB24
500.0000 mg | ORAL_TABLET | Freq: Every day | ORAL | Status: DC
  Administered 2011-02-02 – 2011-02-05 (×4): 500 mg via ORAL
  Filled 2011-02-02 (×4): qty 1

## 2011-02-02 MED ORDER — INSULIN DETEMIR 100 UNIT/ML ~~LOC~~ SOLN
50.0000 [IU] | Freq: Every day | SUBCUTANEOUS | Status: DC
  Administered 2011-02-02 – 2011-02-04 (×3): 50 [IU] via SUBCUTANEOUS
  Filled 2011-02-02: qty 3

## 2011-02-02 MED ORDER — LISINOPRIL 5 MG PO TABS
5.0000 mg | ORAL_TABLET | Freq: Every day | ORAL | Status: DC
  Administered 2011-02-02 – 2011-02-05 (×4): 5 mg via ORAL
  Filled 2011-02-02 (×4): qty 1

## 2011-02-02 MED ORDER — DONEPEZIL HCL 10 MG PO TABS
10.0000 mg | ORAL_TABLET | Freq: Every day | ORAL | Status: DC
  Administered 2011-02-02 – 2011-02-04 (×3): 10 mg via ORAL
  Filled 2011-02-02 (×4): qty 1

## 2011-02-02 MED ORDER — FUROSEMIDE 20 MG PO TABS
20.0000 mg | ORAL_TABLET | Freq: Every evening | ORAL | Status: DC
  Administered 2011-02-02 – 2011-02-04 (×3): 20 mg via ORAL
  Filled 2011-02-02 (×4): qty 1

## 2011-02-02 MED ORDER — WARFARIN SODIUM 5 MG PO TABS
5.0000 mg | ORAL_TABLET | Freq: Every day | ORAL | Status: DC
  Administered 2011-02-02: 5 mg via ORAL
  Filled 2011-02-02 (×2): qty 1

## 2011-02-02 MED ORDER — ARIPIPRAZOLE 15 MG PO TABS
15.0000 mg | ORAL_TABLET | Freq: Every day | ORAL | Status: DC
  Administered 2011-02-02 – 2011-02-05 (×4): 15 mg via ORAL
  Filled 2011-02-02 (×4): qty 1

## 2011-02-02 MED ORDER — CARVEDILOL 12.5 MG PO TABS
12.5000 mg | ORAL_TABLET | Freq: Every day | ORAL | Status: DC
  Administered 2011-02-02 – 2011-02-05 (×4): 12.5 mg via ORAL
  Filled 2011-02-02 (×5): qty 1

## 2011-02-02 MED ORDER — PANTOPRAZOLE SODIUM 40 MG PO TBEC
40.0000 mg | DELAYED_RELEASE_TABLET | Freq: Every day | ORAL | Status: DC
  Administered 2011-02-02 – 2011-02-05 (×4): 40 mg via ORAL
  Filled 2011-02-02 (×3): qty 1

## 2011-02-02 MED ORDER — FUROSEMIDE 40 MG PO TABS
40.0000 mg | ORAL_TABLET | ORAL | Status: DC
  Administered 2011-02-02 – 2011-02-05 (×4): 40 mg via ORAL
  Filled 2011-02-02 (×5): qty 1

## 2011-02-02 NOTE — Progress Notes (Signed)
PHARMACIST - PHYSICIAN COMMUNICATION CONCERNING: Pharmacy Care Issues Regarding Warfarin Labs  RECOMMENDATION (Action Taken): A baseline and daily protime for three days has been ordered to meet the [employer] National Patient safety goal and comply with the current [facility] policy.   The Pharmacy will defer all warfarin dose order changes and follow up of lab results to the prescriber unless an additional order to initiate a "pharmacy Coumadin consult" is placed.  DESCRIPTION:  While hospitalized, to be in compliance with [employer] National Patient Safety Goals, all patients on warfarin must have a baseline and/or current protime prior to the administration of warfarin. Pharmacy has received your order for warfarin without these required laboratory assessments.   

## 2011-02-02 NOTE — Progress Notes (Signed)
ANTICOAGULATION CONSULT NOTE - Initial Consult  Pharmacy Consult for Coumadin Indication: atrial fibrillation  Allergies  Allergen Reactions  . Penicillins Shortness Of Breath    Per MAR. "Throat closes up" per daughter     Patient Measurements: Height: 5\' 10"  (177.8 cm) Weight: 274 lb 7.6 oz (124.5 kg) (bed scale pt unable to stand on scale rn notified julianne) IBW/kg (Calculated) : 73    Vital Signs: Temp: 97.5 F (36.4 C) (12/15 1457) Temp src: Oral (12/15 1457) BP: 115/57 mmHg (12/15 1457) Pulse Rate: 66  (12/15 1457)  Labs:  Basename 02/02/11 0645 02/01/11 1842 02/01/11 1534 02/01/11 1523  HGB 12.9* -- 14.2 --  HCT 39.1 -- 42.4 --  PLT 189 -- 185 --  APTT -- -- -- --  LABPROT -- -- 14.6 --  INR -- -- 1.12 --  HEPARINUNFRC -- -- -- --  CREATININE 1.00 -- 0.73 --  CKTOTAL -- 40 -- 42  CKMB -- 3.0 -- 2.8  TROPONINI -- <0.30 -- <0.30   Estimated Creatinine Clearance: 83.2 ml/min (by C-G formula based on Cr of 1).  Medical History: Past Medical History  Diagnosis Date  . Hypertension   . CHF (congestive heart failure)     H/o right sided CHF   . Arthritis   . Alzheimer's dementia   . Bipolar 1 disorder   . PVD (peripheral vascular disease)   . DVT (deep venous thrombosis)   . Atrial fibrillation   . CVA (cerebral infarction) 2004    right sided  . Dyslipidemia   . Diabetes mellitus   . Venous stasis ulcers      Assessment: Per hospitalist note warfarin per RX Admited for generalized weakness and SOB PTA.  Hx A-Fib in CVR currently and prior CVA.  INR 1.2 < goal 2-3 supposed to be on warfarin 5mg  daily at [facility].  Concern that higher doses will inc INR too much so will give Warfarin 5mg  x1 today and f/u in AM labs  Goal of Therapy:  INR 2-3   Plan:  Warfarin 5mg  today Daily INR  [name] 02/02/2011,3:08 PM

## 2011-02-02 NOTE — Progress Notes (Signed)
DAILY PROGRESS NOTE                              GENERAL INTERNAL MEDICINE [facility]  SUBJECTIVE: Feels okay, denies shortness of breath or chest pain. Son at bedside.  OBJECTIVE: BP 105/66  Pulse 94  Temp(Src) 99 F (37.2 C) (Oral)  Resp 18  Ht 5\' 10"  (1.778 m)  Wt 124.5 kg (274 lb 7.6 oz)  BMI 39.38 kg/m2  SpO2 97% No intake or output data in the 24 hours ending 02/02/11 1243                    Weight change:  Physical Exam: General: Alert and awake oriented x3 not in any acute distress. HEENT: anicteric sclera, pupils equal reactive to light and accommodation CVS: S1-S2 heard, no murmur rubs or gallops Chest: clear to auscultation bilaterally, no wheezing rales or rhonchi Abdomen:  normal bowel sounds, soft, nontender, nondistended, no organomegaly Neuro: Cranial nerves II-XII intact, no focal neurological deficits Extremities: no cyanosis, no clubbing or edema noted bilaterally   Lab Results:  [facility] 02/02/11 0645 02/01/11 1534  NA 137 136  K 4.6 5.3*  CL 99 97  CO2 31 30  GLUCOSE 175* 208*  BUN 18 13  CREATININE 1.00 0.73  CALCIUM 8.9 9.4  MG -- --  PHOS -- --   Basename 02/02/11 0645 02/01/11 1534  WBC 8.5 7.3  NEUTROABS -- 5.0  HGB 12.9* 14.2  HCT 39.1 42.4  MCV 89.5 89.5  PLT 189 185    Basename 02/01/11 1842 02/01/11 1523  CKTOTAL 40 42  CKMB 3.0 2.8  CKMBINDEX -- --  TROPONINI <0.30 <0.30   Studies/Results: Ct Head Wo Contrast  02/01/2011  *RADIOLOGY REPORT*  Clinical Data: Generalized weakness for 6 hours  CT HEAD WITHOUT CONTRAST  Technique:  Contiguous axial images were obtained from the base of the skull through the vertex without contrast.  Comparison: 02/17/2007  Findings:  There is diffuse patchy low density throughout the subcortical and periventricular white matter consistent with chronic small vessel ischemic change.  There is prominence of the sulci and ventricles consistent with brain atrophy.  The ventricular volumes are  increased.  These appear similar in volume to the previous exam.  Right frontal lobe encephalomalacia is identified which is also unchanged from previous exam.  There is no evidence for acute brain infarct, hemorrhage or mass.  The paranasal sinuses are clear.  The mastoid air cells are clear. The skull appears intact.  IMPRESSION:  1.  Small vessel ischemic change and brain atrophy. 2.  Persistent ventriculomegaly.  Cannot rule out normal pressure hydrocephalus.  Original Report Authenticated By: Rosealee Albee, M.D.   Mr Brain Wo Contrast  02/02/2011  *RADIOLOGY REPORT*  Clinical Data: Failure to thrive.  Weakness.  Tremor.  Confusion. Right-sided stroke 2004.  MRI HEAD WITHOUT CONTRAST  Technique:  Multiplanar, multiecho pulse sequences of the brain and surrounding structures were obtained according to standard protocol without intravenous contrast.  Comparison: 02/01/2011 CT.  06/13/2003 MR.  Findings: No acute infarct.  Global atrophy most notable involving the temporal lobes.  Mild progression of ventricular prominence suggestive of hydrocephalus.  No obstructing lesions seen at the level of the aqueduct and this may represent normal pressure hydrocephalus.  Remote posterior right frontal lobe infarct with encephalomalacia and blood breakdown products.  On series 6 image 20, slight altered signal intensity of cerebrospinal fluid within  the sulci.  It is possible this is related to artifact from motion or partial volume averaging. Similar type findings can be seen in patients who are receiving supplemental oxygen.  Result of subarachnoid hemorrhage or proteinaceous material such as seen with meningitis not entirely excluded although there are no other findings on the remaining images to suggest such.  Prominent white matter type changes consistent with result of small vessel disease.  Subependymal reabsorption of fluid not entirely excluded contributing to the periventricular white matter type changes.   No intracranial mass lesion detected on this unenhanced exam.  Major intracranial vascular structures are patent.  Minimal paranasal sinus mucosal thickening.  IMPRESSION: No acute infarct.  Remote posterior right frontal lobe infarct.  Mild progression of ventricular prominence may represent a normal pressure hydrocephalus.  Prominent white matter type changes consistent with result of small vessel disease.  Global atrophy most notable temporal lobes.  Question artifact causing altered signal intensity of cerebrospinal fluid superior convexity on FLAIR sequence as discussed above.  Results were discussed with Dr. Arthor Captain 02/02/2011 11:55 a.m.  Original Report Authenticated By: Fuller Canada, M.D.   Dg Chest Portable 1 View  02/01/2011  *RADIOLOGY REPORT*  Clinical Data: Shortness of breath, weakness and cough.  PORTABLE CHEST - 1 VIEW  Comparison: 03/17/2007.  Findings: Shortness of breath, weakness and cough. Trachea is midline.  Heart is enlarged, stable.  Lungs are low in volume with probable mild scarring at the right lung base.  No pleural fluid.  IMPRESSION: Low lung volumes without acute finding.  Original Report Authenticated By: Reyes Ivan, M.D.   Medications: Scheduled Meds:   . sodium chloride   Intravenous STAT  . ARIPiprazole  15 mg Oral Daily  . carvedilol  12.5 mg Oral Q breakfast  . divalproex  500 mg Oral Daily  . donepezil  10 mg Oral QHS  . DULoxetine  120 mg Oral Daily  . furosemide  20 mg Oral QPM  . furosemide  40 mg Oral Q0700  . insulin detemir  50 Units Subcutaneous QHS  . lisinopril  5 mg Oral Daily  .  morphine injection  4 mg Intravenous Once  . ondansetron  4 mg Intravenous Once  . pantoprazole  40 mg Oral Q1200  . sodium chloride  3 mL Intravenous Q12H  . sodium polystyrene  30 g Oral Once  . warfarin  5 mg Oral q1800   Continuous Infusions:  PRN Meds:.sodium chloride, acetaminophen, acetaminophen, sodium chloride, DISCONTD: ondansetron (ZOFRAN)  IV  ASSESSMENT & PLAN: Active Problems:  DM  OBSTRUCTIVE SLEEP APNEA  HYPERTENSION  CHF (congestive heart failure)  Atrial fibrillation  CVA (cerebral infarction)  Alzheimer's dementia  Bipolar 1 disorder  Failure to thrive  Hyperkalemia  1. Altered mental status: worsening confusion, patient's son mentioned that his symptoms started about 4 weeks ago after he contracted of aspiration pneumonia. He was treated for that appropriately with antibiotics. There is no evidence of infection as he does not have leukocytosis, chest x-ray and urinalysis are clear. Will check a TSH, RPR, B12.  2. Tremors, resting: He was reported to be ambulatory about 4-5 weeks ago. And then involve the resting tremors and trying to rule out Parkinson disease. Family wanted neurological evaluation in the hospital. Neurology consulted.  3. Chronic CHF, seems to be diastolic: Last echocardiogram was done in 2008 showed ejection fraction of 65%, continue the Lasix for now.  4. Failure to thrive and deconditioning: From the son's story patient deconditioning  happened after pneumonia. We'll as physical therapy and individual therapy to evaluate the patient. Patient is a nursing home resident and probably will go back there.  5. Atrial fibrillation: This is rate controlled patient is on warfarin continue in pharmacy to dose the Coumadin.  6. Diabetes mellitus type 2: Continue insulin sliding scale and carbohydrate modified diet.    LOS: 1 day   Chisum Habenicht A 02/02/2011, 12:43 PM

## 2011-02-02 NOTE — Progress Notes (Signed)
Physical Therapy Cancellation Patient Details Name: [name] MRN: [id number] DOB: 1932/10/12 Today's Date: 02/02/2011  Pt currently on bedrest.  Need updated activity order to perform PT eval.  Thanks!!!  Cailen Texeira 02/02/2011, 11:33 AM [phone]

## 2011-02-02 NOTE — Consult Note (Signed)
[facility] Consult Note  Date: 02/02/2011  Patient name: [name] Medical record number: [id number] Date of birth: [birth year] Age: 77 y.o. Gender: male   Chief Complaint: AMS   History of Present Illness: [name] is an 77 y.o. male  with multiple medical problems including stroke, diabetes, afib, Alzheimer's Dementia, Bipolar disorder and CHF.  Admitted to the hospital 02/01/11 due to weakness and confusion. Son, Mardelle Matte, who is at bedside, reports pt to have progressive cognitive and functional decline and bedbound over the last month, during which time he was treated for aspiration PNA with Levaquin.  Prior to this, the patient was wheelchair bound, able to take 1-2 steps to the toilet, play Bingo, feed self and have conversation, with occasional confusion.  Recently, he's been asking questions such as, "When is it time to go to work?, Let's go home now, why are we here?".      Neurology consult requested to evaluate neurologic cause for patients decline.    Meds Inpatient  Scheduled:   . sodium chloride   Intravenous STAT  . ARIPiprazole  15 mg Oral Daily  . carvedilol  12.5 mg Oral Q breakfast  . divalproex  500 mg Oral Daily  . donepezil  10 mg Oral QHS  . DULoxetine  120 mg Oral Daily  . furosemide  20 mg Oral QPM  . furosemide  40 mg Oral Q0700  . insulin detemir  50 Units Subcutaneous QHS  . lisinopril  5 mg Oral Daily  .  morphine injection  4 mg Intravenous Once  . ondansetron  4 mg Intravenous Once  . pantoprazole  40 mg Oral Q1200  . sodium chloride  3 mL Intravenous Q12H  . sodium polystyrene  30 g Oral Once  . warfarin  5 mg Oral q1800    Prior to Admission medications   Medication Sig Start Date End Date Taking? Authorizing Provider  ALPRAZolam (XANAX) 0.25 MG tablet Take 0.25 mg by mouth at bedtime. HOLD FOR SEDATION    Yes Historical Provider, MD  ARIPiprazole (ABILIFY) 15 MG tablet Take 15 mg by mouth daily.     Yes Historical  Provider, MD  carboxymethylcellulose (REFRESH TEARS) 0.5 % SOLN Place 2 drops into both eyes every 2 (two) hours as needed. FOR DRY EYES    Yes Historical Provider, MD  carvedilol (COREG) 12.5 MG tablet Take 12.5 mg by mouth daily. TAKE WITH FOOD    Yes Historical Provider, MD  divalproex (DEPAKOTE ER) 500 MG 24 hr tablet Take 500 mg by mouth daily.     Yes Historical Provider, MD  donepezil (ARICEPT) 10 MG tablet Take 10 mg by mouth at bedtime.     Yes Historical Provider, MD  DULoxetine (CYMBALTA) 60 MG capsule Take 120 mg by mouth daily.     Yes Historical Provider, MD  furosemide (LASIX) 20 MG tablet Take 20 mg by mouth every evening. TAKE AT 1700 DAILY    Yes Historical Provider, MD  furosemide (LASIX) 40 MG tablet Take 40 mg by mouth every morning.     Yes Historical Provider, MD  insulin detemir (LEVEMIR) 100 UNIT/ML injection Inject 50 Units into the skin at bedtime.     Yes Historical Provider, MD  lisinopril (PRINIVIL,ZESTRIL) 5 MG tablet Take 5 mg by mouth daily.     Yes Historical Provider, MD  metFORMIN (GLUCOPHAGE-XR) 500 MG 24 hr tablet Take 500 mg by mouth 2 (two) times daily with a meal.  Yes Historical Provider, MD  omeprazole (PRILOSEC) 20 MG capsule Take 20 mg by mouth daily.     Yes Historical Provider, MD  oxyCODONE-acetaminophen (PERCOCET) 5-325 MG per tablet Take 1-2 tablets by mouth See admin instructions. TAKE 1 TABLET EVERY NIGHT. MAY TAKE 1 TABLET EVERY 4 HOURS AS NEEDED FOR MODERATE PAIN. TAKE 2 TABS EVERY 4 HOURS AS NEEDED FOR SEVERE PAIN.    Yes Historical Provider, MD  senna-docusate (SENOKOT-S) 8.6-50 MG per tablet Take 1 tablet by mouth 2 (two) times daily.     Yes Historical Provider, MD  warfarin (JANTOVEN) 5 MG tablet Take 5 mg by mouth daily.     Yes Historical Provider, MD  zinc oxide 20 % ointment Apply 1 application topically as needed. APPLY TO BUTTOCKS AS NEEDED FOR BLANCHABLE  REDNESS.    Yes Historical Provider, MD     Allergies: Penicillins  Past  Medical History  Diagnosis Date  . Hypertension   . CHF (congestive heart failure)     H/o right sided CHF   . Arthritis   . Alzheimer's dementia   . Bipolar 1 disorder   . PVD (peripheral vascular disease)   . DVT (deep venous thrombosis)   . Atrial fibrillation   . CVA (cerebral infarction) 2004    right sided  . Dyslipidemia   . Diabetes mellitus   . Venous stasis ulcers    History reviewed. No pertinent past surgical history. History reviewed. No pertinent family history. Social History: Resides at [facility], bedbound x 3 yrs.  Max assist with ADL care.  Widowed.   Remote tobacco, no ETOH. Review of Systems: Review of systems not obtained due to patient factors. See HPI.  Examination:  Blood pressure 115/57, pulse 66, temperature 97.5 F (36.4 C), temperature source Oral, resp. rate 18, height 5\' 10"  (1.778 m), weight 124.5 kg (274 lb 7.6 oz), SpO2 98.00%. In general, is lying in bed, obese and falling asleep easily, but will wake to verbal stim. and remains relatively oriented.  Cardiovascular: The patient has an irregular regular rate and rhythm. Brawny venous stasis changes LE's.  Mental status:   The patient is oriented to person, and place. Year 1970, season winter.  Can't recall President.  Recognizes his son accurately.. Recent and remote memory are poorly. Attention span and concentration are diminished. Fund of knowledge of current and historical events is diminished. No aphasia or dysarthria.  Cranial Nerves: Pupils are pinpoint. Visual fields full to confrontation. Extraocular movements are intact without nystagmus. Facial sensation and muscles of mastication are intact. Muscles of facial expression are symmetric. Tongue protrusion, uvula, palate midline.  Shoulder shrug intact.  Motor:  The patient has normal bulk, and increased tone especially of upper extremities.  Mid cogwheel and resting tremor bilaterally.    Strength is 4+/5 upper extremities with  equal grips.  3+/5 LOWER extremities.  Reflexes:   Biceps  Triceps Brachioradialis Knee Ankle  Right 1+  1+  1+   0+ 0+  Left  1+  1+  2+   0+ 0+  Plantars: down going left, upgoing right.  Coordination:  Normal finger to nose.  Rapid alternating movements are slow and difficult for patient to perform.  Sensation: intact to light touch.   Labs: CBC    Component Value Date/Time   WBC 8.5 02/02/2011 0645   RBC 4.37 02/02/2011 0645   HGB 12.9* 02/02/2011 0645   HCT 39.1 02/02/2011 0645   PLT 189 02/02/2011 0645   MCV  89.5 02/02/2011 0645   MCH 29.5 02/02/2011 0645   MCHC 33.0 02/02/2011 0645   RDW 15.7* 02/02/2011 0645   LYMPHSABS 1.5 02/01/2011 1534   MONOABS 0.5 02/01/2011 1534   EOSABS 0.3 02/01/2011 1534   BASOSABS 0.0 02/01/2011 1534   Chemistry    Component Value Date/Time   NA 137 02/02/2011 0645   K 4.6 02/02/2011 0645   CL 99 02/02/2011 0645   CO2 31 02/02/2011 0645   GLUCOSE 175* 02/02/2011 0645   BUN 18 02/02/2011 0645   CREATININE 1.00 02/02/2011 0645   CALCIUM 8.9 02/02/2011 0645   Cardiac Enzymes    Component Value Date/Time   CKTOTAL 40 02/01/2011 1842   CKTOTAL 42 02/01/2011 1523   CKMB 3.0 02/01/2011 1842   CKMB 2.8 02/01/2011 1523   TROPONINI <0.30 02/01/2011 1842   TROPONINI <0.30 02/01/2011 1523   Thyroid    Component Value Date/Time   TSH 2.731 02/02/2011 0645   Micro Recent Results (from the past 240 hour(s))  MRSA PCR SCREENING     Status: Normal   Collection Time   02/02/11  2:55 AM      Component Value Range Status Comment   MRSA by PCR NEGATIVE  NEGATIVE  Final     Imaging:  02/01/2011 CT HEAD WITHOUT CONTRAST   (Comparison: 02/17/2007)  Findings:  There is diffuse patchy low density throughout the subcortical and periventricular white matter consistent with chronic small vessel ischemic change.  There is prominence of the sulci and ventricles consistent with brain atrophy.  The ventricular volumes are increased.  These  appear similar in volume to the previous exam.  Right frontal lobe encephalomalacia is identified which is also unchanged from previous exam.  There is no evidence for acute brain infarct, hemorrhage or mass.  The paranasal sinuses are clear.  The mastoid air cells are clear. The skull appears intact.  IMPRESSION:  1.  Small vessel ischemic change and brain atrophy. 2.  Persistent ventriculomegaly.  Cannot rule out normal pressure hydrocephalus.  Rosealee Albee, M.D.   02/02/2011 MRI HEAD WITHOUT CONTRAST   Findings: No acute infarct.  Global atrophy most notable involving the temporal lobes.  Mild progression of ventricular prominence suggestive of hydrocephalus.  No obstructing lesions seen at the level of the aqueduct and this may represent normal pressure hydrocephalus.  Remote posterior right frontal lobe infarct with encephalomalacia and blood breakdown products.  On series 6 image 20, slight altered signal intensity of cerebrospinal fluid within the sulci.  It is possible this is related to artifact from motion or partial volume averaging. Similar type findings can be seen in patients who are receiving supplemental oxygen.  Result of subarachnoid hemorrhage or proteinaceous material such as seen with meningitis not entirely excluded although there are no other findings on the remaining images to suggest such.  Prominent white matter type changes consistent with result of small vessel disease.  Subependymal reabsorption of fluid not entirely excluded contributing to the periventricular white matter type changes.  No intracranial mass lesion detected on this unenhanced exam.  Major intracranial vascular structures are patent.  Minimal paranasal sinus mucosal thickening.  IMPRESSION: No acute infarct.  Remote posterior right frontal lobe infarct.  Mild progression of ventricular prominence may represent a normal pressure hydrocephalus.  Prominent white matter type changes consistent with result of small  vessel disease.  Global atrophy most notable temporal lobes.  Question artifact causing altered signal intensity of cerebrospinal fluid superior convexity on FLAIR sequence as  discussed above.  Fuller Canada, M.D.   02/01/2011 PORTABLE CHEST - 1 VIEW IMPRESSION: Low lung volumes without acute finding.   Reyes Ivan, M.D.    Procedures: EKG: atrial flutter, ventricular rate 87, RBBB.   Assessment Mr. [name] is a 77 y.o. male with a 1 month history of progressive failure to thrive; unable to feed self, bare weight, or hold conversation.  CT/MRI shows ventricular prominence which may represent NPH.  Unfortuantly, gait assessment is required to make this diagnosis, and pt is unable to do so.  LP is not of utility as pressure would be normal. Additionally, tx for NPH would mainly help gait, would necessarily improve cognitive function.  While NPH cannot be excluded, this is less likely, especially given chronicity of CT findings.  Metabolic/Infectious/Organic etiology more likely.   Recommendations:  1. RPR, ESR, B12, Folate, ammonia levels. 2. Consider EEG if remainder of w/u unremarkable.  If normal, would D/C depakote- family reports no manic episode since young age. 3. If progression of Dementia suspected, consider Namenda. 4. Consider reducing polypharmacy (D/C oxycodone and xanax) and switch to night time dosing of Abilify and Cymbalta. 5. Bariatric bed. 6. Consider re-evaluation of sleep apnea.   LOS: 1 day   Jana Hakim [facility] [phone] 02/02/2011  3:10 PM  Attending Attestation: I have seen and examined this patient and discussed his history and outlook at length with his son. Given the time-course of his symptoms, I would favor metabolic/infectious or medication effect as the cause to his decline. While I cannot rule out NPH, his scans have been relatively stable for the past 3 years. Further testing to prove NPH will be difficult as he is not  ambulatory and treatment would be unlikely to help his mental status. I agree with the plan outlined above. If his labwork is unrevealing, I would consider if he really needs the Depakote, as he has not had a manic episode since his 11s or 30s. I would continue it until an EEG can be performed on Monday and then D/C. I would also consider if his sleep apnea is playing a role. Despite his unimpressive pCO2, poor sleep quality may be adversely affecting his mental status as well.  We will continue to follow this patient along with you. Thank you for this consultation.  Kipp Laurence, MD [facility] [phone] 02/02/11 17:33

## 2011-02-02 NOTE — Progress Notes (Signed)
Patient was transferred to 4700, was sent to 4500 in error.  The patient needed telemetry unit.  Report was called to RN on 4700, and patient was transferred in stable condition.

## 2011-02-03 LAB — GLUCOSE, CAPILLARY
Glucose-Capillary: 262 mg/dL — ABNORMAL HIGH (ref 70–99)
Glucose-Capillary: 133 mg/dL — ABNORMAL HIGH (ref 70–99)

## 2011-02-03 LAB — CBC
Platelets: 166 10*3/uL (ref 150–400)
RBC: 4.32 MIL/uL (ref 4.22–5.81)
WBC: 7.1 10*3/uL (ref 4.0–10.5)

## 2011-02-03 LAB — AMMONIA: Ammonia: 14 umol/L (ref 11–60)

## 2011-02-03 LAB — BASIC METABOLIC PANEL
Calcium: 9.2 mg/dL (ref 8.4–10.5)
GFR calc non Af Amer: 58 mL/min — ABNORMAL LOW (ref >90)
Sodium: 140 mEq/L (ref 135–145)

## 2011-02-03 LAB — PROTIME-INR: INR: 1.19 (ref 0.00–1.49)

## 2011-02-03 LAB — VITAMIN B12: Vitamin B-12: 528 pg/mL (ref 211–911)

## 2011-02-03 LAB — FOLATE: Folate: 7.9 ng/mL

## 2011-02-03 MED ORDER — WARFARIN SODIUM 7.5 MG PO TABS
7.5000 mg | ORAL_TABLET | Freq: Once | ORAL | Status: AC
  Administered 2011-02-03: 7.5 mg via ORAL
  Filled 2011-02-03: qty 1

## 2011-02-03 MED ORDER — OXYCODONE-ACETAMINOPHEN 5-325 MG PO TABS
1.0000 | ORAL_TABLET | Freq: Four times a day (QID) | ORAL | Status: DC | PRN
  Administered 2011-02-03 – 2011-02-04 (×2): 2 via ORAL
  Administered 2011-02-05: 1 via ORAL
  Filled 2011-02-03: qty 2
  Filled 2011-02-03: qty 1
  Filled 2011-02-03: qty 2

## 2011-02-03 NOTE — Progress Notes (Signed)
Subjective: I feel good this morning.    Objective: Blood pressure 96/60, pulse 59, temperature 97.5 F (36.4 C), temperature source Oral, resp. rate 18, height 5\' 10"  (1.778 m), weight 123.2 kg (271 lb 9.7 oz), SpO2 98.00%.  Intake/Output Summary (Last 24 hours) at 02/03/11 0855 Last data filed at 02/03/11 0981  Gross per 24 hour  Intake    560 ml  Output    175 ml  Net    385 ml    Exam: Mental Status: Alert, oriented to place and situation, thought content appropriate.  Speech fluent without evidence of aphasia. Able to follow commands without difficulty.  Much more alert and conversant today. Cranial Nerves: II- Visual fields grossly intact. III/IV/VI-Extraocular movements intact.  Pupils pinpoint. V/VII-Smile symmetric VIII-grossly intact XI-bilateral shoulder shrug XII-midline tongue extension Motor:The patient has normal bulk, and increased tone especially of upper extremities. Mid cogwheel and resting tremor bilaterally.  Strength is 4+/5 upper extremities with equal grips. 3+/5 LOWER extremities. Sensory: Light touch intact throughout, bilaterally  Labs: INR 1.19 RPR NR ESR 23 Ammonia 14 B12 528 Folate 7.9  Assessment Mr. TAMARIO HEAL is a 77 y.o. male failure to thrive and confusion, now appears to be improving with respect to level of alertness and ability to conversate.  Brain imaging suggested ventricular enlargement, however chronic. Labs are unremarkable.  While NPH cannot be excluded, this is less likely. Suspect progression of organic brain disease, possibly worsened by recent aspiration pneumonia.     Plan: 1.  EEG scheduled for Monday.  If negative, no further neurologic intervention will be recommended.   If further questions arise, please call or page at that time.  Thank you for allowing neurology to participate in the care of this patient.  Marya Fossa PA-C [facility] [phone] 02/03/2011  9:11 AM  LOS: 2 days

## 2011-02-03 NOTE — Progress Notes (Signed)
DAILY PROGRESS NOTE                              GENERAL INTERNAL MEDICINE [facility]  SUBJECTIVE: Feels okay, denies shortness of breath or chest pain. Son at bedside.  OBJECTIVE: BP 96/60  Pulse 59  Temp(Src) 97.5 F (36.4 C) (Oral)  Resp 18  Ht 5\' 10"  (1.778 m)  Wt 123.2 kg (271 lb 9.7 oz)  BMI 38.97 kg/m2  SpO2 98%  Intake/Output Summary (Last 24 hours) at 02/03/11 0910 Last data filed at 02/03/11 1610  Gross per 24 hour  Intake    560 ml  Output    175 ml  Net    385 ml                      Weight change: -1.3 kg (-2 lb 13.9 oz) Physical Exam: General: Alert and awake oriented x3 not in any acute distress. HEENT: anicteric sclera, pupils equal reactive to light and accommodation CVS: S1-S2 heard, no murmur rubs or gallops Chest: clear to auscultation bilaterally, no wheezing rales or rhonchi Abdomen:  normal bowel sounds, soft, nontender, nondistended, no organomegaly Neuro: Cranial nerves II-XII intact, no focal neurological deficits Extremities: no cyanosis, no clubbing or edema noted bilaterally   Lab Results:  [facility] 02/03/11 0644 02/02/11 0645  NA 140 137  K 4.1 4.6  CL 98 99  CO2 34* 31  GLUCOSE 143* 175*  BUN 23 18  CREATININE 1.19 1.00  CALCIUM 9.2 8.9  MG -- --  PHOS -- --    Basename 02/03/11 0644 02/02/11 0645 02/01/11 1534  WBC 7.1 8.5 --  NEUTROABS -- -- 5.0  HGB 12.6* 12.9* --  HCT 39.5 39.1 --  MCV 91.4 89.5 --  PLT 166 189 --    Basename 02/01/11 1842 02/01/11 1523  CKTOTAL 40 42  CKMB 3.0 2.8  CKMBINDEX -- --  TROPONINI <0.30 <0.30   Studies/Results: Ct Head Wo Contrast  02/01/2011  *RADIOLOGY REPORT*  Clinical Data: Generalized weakness for 6 hours  CT HEAD WITHOUT CONTRAST  Technique:  Contiguous axial images were obtained from the base of the skull through the vertex without contrast.  Comparison: 02/17/2007  Findings:  There is diffuse patchy low density throughout the subcortical and periventricular white matter  consistent with chronic small vessel ischemic change.  There is prominence of the sulci and ventricles consistent with brain atrophy.  The ventricular volumes are increased.  These appear similar in volume to the previous exam.  Right frontal lobe encephalomalacia is identified which is also unchanged from previous exam.  There is no evidence for acute brain infarct, hemorrhage or mass.  The paranasal sinuses are clear.  The mastoid air cells are clear. The skull appears intact.  IMPRESSION:  1.  Small vessel ischemic change and brain atrophy. 2.  Persistent ventriculomegaly.  Cannot rule out normal pressure hydrocephalus.  Original Report Authenticated By: Rosealee Albee, M.D.   Mr Brain Wo Contrast  02/02/2011  *RADIOLOGY REPORT*  Clinical Data: Failure to thrive.  Weakness.  Tremor.  Confusion. Right-sided stroke 2004.  MRI HEAD WITHOUT CONTRAST  Technique:  Multiplanar, multiecho pulse sequences of the brain and surrounding structures were obtained according to standard protocol without intravenous contrast.  Comparison: 02/01/2011 CT.  06/13/2003 MR.  Findings: No acute infarct.  Global atrophy most notable involving the temporal lobes.  Mild progression of ventricular prominence suggestive of hydrocephalus.  No obstructing lesions seen at the level of the aqueduct and this may represent normal pressure hydrocephalus.  Remote posterior right frontal lobe infarct with encephalomalacia and blood breakdown products.  On series 6 image 20, slight altered signal intensity of cerebrospinal fluid within the sulci.  It is possible this is related to artifact from motion or partial volume averaging. Similar type findings can be seen in patients who are receiving supplemental oxygen.  Result of subarachnoid hemorrhage or proteinaceous material such as seen with meningitis not entirely excluded although there are no other findings on the remaining images to suggest such.  Prominent white matter type changes  consistent with result of small vessel disease.  Subependymal reabsorption of fluid not entirely excluded contributing to the periventricular white matter type changes.  No intracranial mass lesion detected on this unenhanced exam.  Major intracranial vascular structures are patent.  Minimal paranasal sinus mucosal thickening.  IMPRESSION: No acute infarct.  Remote posterior right frontal lobe infarct.  Mild progression of ventricular prominence may represent a normal pressure hydrocephalus.  Prominent white matter type changes consistent with result of small vessel disease.  Global atrophy most notable temporal lobes.  Question artifact causing altered signal intensity of cerebrospinal fluid superior convexity on FLAIR sequence as discussed above.  Results were discussed with Dr. Arthor Captain 02/02/2011 11:55 a.m.  Original Report Authenticated By: Fuller Canada, M.D.   Dg Chest Portable 1 View  02/01/2011  *RADIOLOGY REPORT*  Clinical Data: Shortness of breath, weakness and cough.  PORTABLE CHEST - 1 VIEW  Comparison: 03/17/2007.  Findings: Shortness of breath, weakness and cough. Trachea is midline.  Heart is enlarged, stable.  Lungs are low in volume with probable mild scarring at the right lung base.  No pleural fluid.  IMPRESSION: Low lung volumes without acute finding.  Original Report Authenticated By: Reyes Ivan, M.D.   Medications: Scheduled Meds:    . sodium chloride   Intravenous STAT  . ARIPiprazole  15 mg Oral Daily  . carvedilol  12.5 mg Oral Q breakfast  . divalproex  500 mg Oral Daily  . donepezil  10 mg Oral QHS  . DULoxetine  120 mg Oral Daily  . furosemide  20 mg Oral QPM  . furosemide  40 mg Oral Q0700  . insulin detemir  50 Units Subcutaneous QHS  . lisinopril  5 mg Oral Daily  . pantoprazole  40 mg Oral Q1200  . sodium chloride  3 mL Intravenous Q12H  . warfarin  5 mg Oral q1800   Continuous Infusions:  PRN Meds:.sodium chloride, acetaminophen, acetaminophen, sodium  chloride  ASSESSMENT & PLAN: Active Problems:  DM  OBSTRUCTIVE SLEEP APNEA  HYPERTENSION  CHF (congestive heart failure)  Atrial fibrillation  CVA (cerebral infarction)  Alzheimer's dementia  Bipolar 1 disorder  Failure to thrive  Hyperkalemia  1. Altered mental status: worsening confusion, patient's son mentioned that his symptoms started about 4 weeks ago after he contracted of aspiration pneumonia. He was treated for that appropriately with antibiotics. N neurology is following. Normal B12, folate and nonreactive RPR, the sedimentation rate is 23. Recommended to in discontinued he oxycodone and Xanax into night time dose of Abilify and Cymbalta. Patient probably might benefit from reevaluation for sleep apnea. This is usually will be done as outpatient. We'll forward this to his primary care provider.  2. Tremors, resting: He was reported to be ambulatory about 4-5 weeks ago. And then involve the resting tremors and trying to rule out Parkinson disease.  3. Chronic CHF, seems to be diastolic: Last echocardiogram was done in 2008 showed ejection fraction of 65%, continue the Lasix for now. Does not exhibit any symptoms or signs of decompensation, patient is on the room air.  4. Failure to thrive and deconditioning: From the son's story patient deconditioning happened after pneumonia. We'll as physical therapy and individual therapy to evaluate the patient. Patient is a nursing home resident and probably will go back there.  5. Atrial fibrillation: This is rate controlled patient is on warfarin continue in pharmacy to dose the Coumadin.  6. Diabetes mellitus type 2: Continue insulin sliding scale and carbohydrate modified diet.    LOS: 2 days   Valina Maes A 02/03/2011, 9:10 AM

## 2011-02-03 NOTE — Progress Notes (Signed)
ANTICOAGULATION CONSULT NOTE - Follow Up Consult  Pharmacy Consult for Coumadin Indication: atrial fibrillation  Allergies  Allergen Reactions  . Penicillins Shortness Of Breath    Per MAR. "Throat closes up" per daughter     Patient Measurements: Height: 5\' 10"  (177.8 cm) Weight: 271 lb 9.7 oz (123.2 kg) (bed wt) IBW/kg (Calculated) : 73   Vital Signs: Temp: 97.5 F (36.4 C) (12/16 0309) BP: 100/69 mmHg (12/16 1115) Pulse Rate: 59  (12/16 0309)  Labs:  Basename 02/03/11 0644 02/02/11 0645 02/01/11 1842 02/01/11 1534 02/01/11 1523  HGB 12.6* 12.9* -- -- --  HCT 39.5 39.1 -- 42.4 --  PLT 166 189 -- 185 --  APTT -- -- -- -- --  LABPROT 15.4* -- -- 14.6 --  INR 1.19 -- -- 1.12 --  HEPARINUNFRC -- -- -- -- --  CREATININE 1.19 1.00 -- 0.73 --  CKTOTAL -- -- 40 -- 42  CKMB -- -- 3.0 -- 2.8  TROPONINI -- -- <0.30 -- <0.30   Estimated Creatinine Clearance: 69.5 ml/min (by C-G formula based on Cr of 1.19).    Assessment: Admit for weakenss.  A-Fib CVR with stroke hx.  INR 1.19 < goal 2-3 supposed to be on Coumadin 5mg  daily at [facility] but INR 1.12 on admit.  Slight response to coumadin 5mg  yesterday.  Cbc stable no bleeding noted. Goal of Therapy:  INR 2-3   Plan:  Coumadin 7.5mg  x1 today Daily INR  [name] 02/03/2011,1:25 PM

## 2011-02-04 ENCOUNTER — Other Ambulatory Visit: Payer: Self-pay

## 2011-02-04 ENCOUNTER — Observation Stay (HOSPITAL_COMMUNITY): Payer: Medicare Other

## 2011-02-04 LAB — GLUCOSE, CAPILLARY
Glucose-Capillary: 148 mg/dL — ABNORMAL HIGH (ref 70–99)
Glucose-Capillary: 189 mg/dL — ABNORMAL HIGH (ref 70–99)
Glucose-Capillary: 248 mg/dL — ABNORMAL HIGH (ref 70–99)

## 2011-02-04 MED ORDER — WARFARIN SODIUM 7.5 MG PO TABS
7.5000 mg | ORAL_TABLET | Freq: Once | ORAL | Status: AC
  Administered 2011-02-04: 7.5 mg via ORAL
  Filled 2011-02-04 (×2): qty 1

## 2011-02-04 NOTE — Progress Notes (Signed)
ANTICOAGULATION CONSULT NOTE - Follow Up Consult  Pharmacy Consult for Coumadin Indication: atrial fibrillation  Allergies  Allergen Reactions  . Penicillins Shortness Of Breath    Per MAR. "Throat closes up" per daughter     Patient Measurements: Height: 5\' 10"  (177.8 cm) Weight: 272 lb 4.3 oz (123.5 kg) IBW/kg (Calculated) : 73   Vital Signs: Temp: 98.5 F (36.9 C) (12/17 0454) Temp src: Oral (12/17 0454) BP: 108/66 mmHg (12/17 0454) Pulse Rate: 57  (12/17 0454)  Labs:  Alvira Philips 02/04/11 4098 02/03/11 0644 02/02/11 0645 02/01/11 1842 02/01/11 1534 02/01/11 1523  HGB -- 12.6* 12.9* -- -- --  HCT -- 39.5 39.1 -- 42.4 --  PLT -- 166 189 -- 185 --  APTT -- -- -- -- -- --  LABPROT 15.2 15.4* -- -- 14.6 --  INR 1.18 1.19 -- -- 1.12 --  HEPARINUNFRC -- -- -- -- -- --  CREATININE -- 1.19 1.00 -- 0.73 --  CKTOTAL -- -- -- 40 -- 42  CKMB -- -- -- 3.0 -- 2.8  TROPONINI -- -- -- <0.30 -- <0.30   Estimated Creatinine Clearance: 69.6 ml/min (by C-G formula based on Cr of 1.19).    Assessment: 72 YOM with hx TIA on Coumadin for Afib.  INR remains subtherapeutic.  Goal of Therapy:  INR 2-3   Plan:  1.  Repeat Coumadin 7.5mg  PO today 2.  Daily INR   Laney Potash, Shari Natt Dien 02/04/2011,10:38 AM

## 2011-02-04 NOTE — Progress Notes (Signed)
Subjective: Pt denies any pain or complaints at this time. Knows his name, that he is in the hospital and that it is the winter. Does not know the year.   Objective: Vital signs in last 24 hours: Filed Vitals:   02/03/11 1342 02/03/11 2139 02/04/11 0414 02/04/11 0454  BP: 115/66 112/64  108/66  Pulse: 91 59  57  Temp: 98.4 F (36.9 C) 98.4 F (36.9 C)  98.5 F (36.9 C)  TempSrc: Oral Oral  Oral  Resp: 18 18  18   Height:      Weight:   272 lb 4.3 oz (123.5 kg)   SpO2: 94% 97%  94%   Weight change: 10.6 oz (0.3 kg)  Intake/Output Summary (Last 24 hours) at 02/04/11 1408 Last data filed at 02/04/11 1259  Gross per 24 hour  Intake    493 ml  Output    450 ml  Net     43 ml    Physical Exam: Neurologic Examination:  Mental Status:  Patient is A and  O times 2. Knows who he is and where but not when. He is not aphasic.   Cranial Nerves:  II: patient does respond to confrontation bilaterally, pupils right 2 mm, left 2 mm,and intact bilaterally  III,IV,VI: EOM intact, visual fields intact .  V,VII hearing slightly decreased but intact bilaterally  IX,X: gag reflex present,  XI: trapezius strength equal bilaterally  XII: tongue normal, no deviation  Motor:  Moves all 4 extremities spontaneously, cannot move his feet well, unable to lift his legs more than 1 inch off the bed, hand grip strength decreased bilaterally, proximal arm muscles intact and equal.  Sensory:  No deficits and equal globally Deep Tendon Reflexes:  2+ upper extremity bilaterally, lower extremity reflexes not appreciated.  Plantars:  Mute bilaterally Cerebellar:  Finger to nose intact, resting hand tremor noted  Lab Results: Basic Metabolic Panel:  Lab 02/03/11 4540 02/02/11 0645  NA 140 137  K 4.1 4.6  CL 98 99  CO2 34* 31  GLUCOSE 143* 175*  BUN 23 18  CREATININE 1.19 1.00  CALCIUM 9.2 8.9  MG -- --  PHOS -- --    Lab 02/03/11 0644  AMMONIA 14   CBC:  Lab 02/03/11 0644 02/02/11 0645  02/01/11 1534  WBC 7.1 8.5 --  NEUTROABS -- -- 5.0  HGB 12.6* 12.9* --  HCT 39.5 39.1 --  MCV 91.4 89.5 --  PLT 166 189 --   Cardiac Enzymes:  Lab 02/01/11 1842 02/01/11 1523  CKTOTAL 40 42  CKMB 3.0 2.8  CKMBINDEX -- --  TROPONINI <0.30 <0.30   D-Dimer:  Lab 02/01/11 1534  DDIMER 0.37   CBG:  Lab 02/04/11 1109 02/04/11 0654 02/03/11 2256 02/03/11 0623 02/02/11 2130  GLUCAP 183* 148* 247* 133* 262*  Thyroid Function Tests:  Lab 02/02/11 0645  TSH 2.731  T4TOTAL --  FREET4 --  T3FREE --  THYROIDAB --   Coagulation:  Lab 02/04/11 0635 02/03/11 0644 02/01/11 1534  LABPROT 15.2 15.4* 14.6  INR 1.18 1.19 1.12   Anemia Panel:  Lab 02/02/11 1741  VITAMINB12 528  FOLATE 7.9  FERRITIN --  TIBC --  IRON --  RETICCTPCT --   Micro Results: Recent Results (from the past 240 hour(s))  MRSA PCR SCREENING     Status: Normal   Collection Time   02/02/11  2:55 AM      Component Value Range Status Comment   MRSA by PCR NEGATIVE  NEGATIVE  Final    Medications: I have reviewed the patient's current medications. Scheduled Meds:   . ARIPiprazole  15 mg Oral Daily  . carvedilol  12.5 mg Oral Q breakfast  . divalproex  500 mg Oral Daily  . donepezil  10 mg Oral QHS  . DULoxetine  120 mg Oral Daily  . furosemide  20 mg Oral QPM  . furosemide  40 mg Oral Q0700  . insulin detemir  50 Units Subcutaneous QHS  . lisinopril  5 mg Oral Daily  . pantoprazole  40 mg Oral Q1200  . sodium chloride  3 mL Intravenous Q12H  . warfarin  7.5 mg Oral ONCE-1800  . warfarin  7.5 mg Oral ONCE-1800   Continuous Infusions:  PRN Meds:.sodium chloride, acetaminophen, acetaminophen, oxyCODONE-acetaminophen, sodium chloride   Assessment/Plan: Functional decline/mental status change - Pt seems to have had some improvement during his hospital stay. Possibly residual mental status change from past pneumonia. If so would expect changes to clear up over the next several weeks gradually. Pt is  stable from a neurologic standpoint.   Plan: 1. EEG done - to be read. Will not stop depakote as this time.  Not likely causing mental status issues.      LOS: 3 days   Genella Mech 02/04/2011, 2:08 PM  Patient seen and examined. I agree with the above.  Thana Farr, MD [facility] [phone]  02/04/2011  4:02 PM

## 2011-02-04 NOTE — Procedures (Signed)
EEG NUMBER:  REFERRING PHYSICIAN:  Dr. Peyton Bottoms.  HISTORY:  A 76 year old male with decreased mental status.  MEDICATIONS:  Abilify, Coreg, Depakote, Aricept, Cymbalta, Levemir, Lasix, lisinopril, Protonix.  CONDITIONS OF RECORDING:  This is a 16-channel EEG carried out with the patient in the awake state.  DESCRIPTION:  The background activity is poorly sustained.  On rare occasions, a posterior background rhythm can be seen at 8-9 Hz alpha activity seen from the parieto-occipital and posterotemporal regions. Throughout the majority of the tracing, there is a mixture of poorly organized frequencies that include theta and alpha rhythms.  Faster rhythms were noted anteriorly.  Hypoventilation was not performed. Intermittent photic stimulation failed to elicit any change in the tracing.  The patient did not drowse or sleep.  IMPRESSION:  This is a borderline EEG.  No epileptiform activity was noted.  A normal posterior background rhythm was captured.          ______________________________ Thana Farr, MD    [id number] D:  02/04/2011 18:08:47  T:  02/04/2011 20:54:05  Job #:  [id number]

## 2011-02-04 NOTE — Progress Notes (Signed)
Utilization review complete 

## 2011-02-04 NOTE — Progress Notes (Signed)
Occupational Therapy Evaluation Patient Details Name: [name] MRN: [id number] DOB: 1932/02/24 Today's Date: 02/04/2011  Problem List:  Patient Active Problem List  Diagnoses  . DM  . OBSTRUCTIVE SLEEP APNEA  . HYPERTENSION  . CHF (congestive heart failure)  . Atrial fibrillation  . CVA (cerebral infarction)  . Alzheimer's dementia  . Bipolar 1 disorder  . Failure to thrive  . Hyperkalemia    Past Medical History:  Past Medical History  Diagnosis Date  . Hypertension   . CHF (congestive heart failure)     H/o right sided CHF   . Arthritis   . Alzheimer's dementia   . Bipolar 1 disorder   . PVD (peripheral vascular disease)   . DVT (deep venous thrombosis)   . Atrial fibrillation   . CVA (cerebral infarction) 2004    right sided  . Dyslipidemia   . Diabetes mellitus   . Venous stasis ulcers    Past Surgical History: History reviewed. No pertinent past surgical history.  OT Assessment/Plan/Recommendation OT Assessment Clinical Impression Statement: Pt. will benefit from OT in acute care setting to increase functional independence with ADLs and get to min assist/supervision level with upper body tasks prior to D/C to next venue of care OT Recommendation/Assessment: Patient will need skilled OT in the acute care venue OT Problem List: Decreased strength;Decreased activity tolerance;Impaired balance (sitting and/or standing);Decreased cognition;Decreased safety awareness;Decreased knowledge of use of DME or AE;Decreased knowledge of precautions Barriers to Discharge: Decreased caregiver support OT Therapy Diagnosis : Generalized weakness;Cognitive deficits OT Plan OT Frequency: Min 1X/week OT Treatment/Interventions: Self-care/ADL training;Therapeutic activities;Patient/family education;Balance training;Cognitive remediation/compensation OT Recommendation Follow Up Recommendations: Skilled nursing facility Equipment Recommended: Other (comment);Defer to next  venue Individuals Consulted Consulted and Agree with Results and Recommendations: Patient OT Goals Acute Rehab OT Goals OT Goal Formulation: With patient Time For Goal Achievement: 2 weeks ADL Goals Pt Will Perform Grooming: with set-up;Supported;Sitting, edge of bed;with min assist ADL Goal: Grooming - Progress: Progressing toward goals Pt Will Perform Upper Body Bathing: with min assist;Sitting, edge of bed ADL Goal: Upper Body Bathing - Progress: Progressing toward goals Pt Will Perform Upper Body Dressing: with min assist;Sitting, bed ADL Goal: Upper Body Dressing - Progress: Progressing toward goals Additional ADL Goal #1: Pt. will feed himself supine in bed with HOB up 75% of meal with minimal verbal cues and supervision. ADL Goal: Additional Goal #1 - Progress: Progressing toward goals  OT Evaluation Precautions/Restrictions  Precautions Precautions: Fall Prior Functioning Home Living Bathroom Shower/Tub:  (small step over) Additional Comments: Pt was at [facility] prior to admission. Prior Function Level of Independence: Needs assistance with gait;Needs assistance with tranfers;Needs assistance with ADLs Bath: Total Toileting: Total Dressing: Total Grooming: Moderate Able to Take Stairs?: No Driving: No ADL ADL Eating/Feeding: Simulated;Maximal assistance Eating/Feeding Details (indicate cue type and reason): Pt. requiring max verbal cues to complete feeding and hand over hand support to perform  Where Assessed - Eating/Feeding: Bed level Grooming: Performed;Wash/dry face;Minimal assistance Grooming Details (indicate cue type and reason): With use of left hand and min assist for thoroughness due to pt. becoming fatigued. Where Assessed - Grooming: Unsupported;Sitting, bed Upper Body Bathing: Performed;Left arm;Right arm;Maximal assistance Upper Body Bathing Details (indicate cue type and reason): Pt. able to wash under left arm and provided assist for thoroughness  of rt. underarm Where Assessed - Upper Body Bathing: Supine, head of bed up Lower Body Bathing: Simulated;+1 Total assistance Where Assessed - Lower Body Bathing: Sit to stand from bed  Upper Body Dressing: Performed;Maximal assistance Upper Body Dressing Details (indicate cue type and reason): With donning gown Where Assessed - Upper Body Dressing: Sitting, bed;Unsupported Lower Body Dressing: Performed;+1 Total assistance Lower Body Dressing Details (indicate cue type and reason): with donning bilateral socks Where Assessed - Lower Body Dressing: Supine, head of bed up Toilet Transfer: Not assessed Toilet Transfer Method: Not assessed Toileting - Clothing Manipulation: Not assessed Toileting - Hygiene: Performed;+1 Total assistance Toileting - Hygiene Details (indicate cue type and reason): Pt. unable to reach for peri care Where Assessed - Toileting Hygiene: Rolling right and/or left Tub/Shower Transfer: Not assessed Tub/Shower Transfer Method: Not assessed ADL Comments: Pt. completed ADL tasks sitting EOB and while supine in bed due to pt. with decreased activity tolerance and reporting fatigue. Pt. Provided with max assist to sit upright due to rt. lateral lean due to prior CVA Vision/Perception  Vision - History Baseline Vision: No visual deficits Patient Visual Report: No change from baseline Vision - Assessment Eye Alignment: Within Functional Limits Vision Assessment: Vision not tested Cognition Cognition Arousal/Alertness: Awake/alert Overall Cognitive Status: History of cognitive impairments History of Cognitive Impairment: Appears at baseline functioning Orientation Level: Oriented to person;Oriented to place Cognition - Other Comments: Pt. with baseline dementia  Sensation/Coordination Sensation Light Touch: Appears Intact Stereognosis: Not tested Hot/Cold: Not tested Proprioception: Not tested Additional Comments: pt. with prior CVA resulting in rt. ue tremors  intermittently Coordination Gross Motor Movements are Fluid and Coordinated: No Fine Motor Movements are Fluid and Coordinated: No Coordination and Movement Description: Rt. UE resting tremor Extremity Assessment RUE Assessment RUE Assessment: Within Functional Limits (pt. with resting tremor, uses cup with lid for drinking) LUE Assessment LUE Assessment: Within Functional Limits Mobility  Bed Mobility Bed Mobility: Yes Rolling Right: 1: +2 Total assist;Patient percentage (comment);With rail (pt=50%) Rolling Right Details (indicate cue type and reason): Pt. able to initiate roll with min verbal cues and manual facilitation of UE crossing midline to facilitate the roll. Rolling Left: 1: +2 Total assist;Patient percentage (comment);With rail (pt=50%) Rolling Left Details (indicate cue type and reason): Pt needed (A) to complete roll and VCs for hand placement and proper technique. Right Sidelying to Sit: 1: +2 Total assist;Patient percentage (comment);With rails Right Sidelying to Sit Details (indicate cue type and reason): Assist to facilitate trunk off bed Sit to Supine - Right: 1: +2 Total assist;Patient percentage (comment);Other (comment) (pt=10%) Sit to Supine - Right Details (indicate cue type and reason): (A) with LE back into bed and (A) to slowly lower trunk into bed. Transfers Transfers: No    End of Session OT - End of Session Activity Tolerance: Patient limited by fatigue Patient left: in bed;with call bell in reach Nurse Communication: Need for lift equipment General Behavior During Session: [facility] for tasks performed Cognition: Impaired, at baseline  Co-evaluation with Flora Lipps, OTR/L Pager [phone] 02/04/2011, 2:46 PM

## 2011-02-04 NOTE — Progress Notes (Signed)
DAILY PROGRESS NOTE                              [facility]  SUBJECTIVE: Feels okay, denies any complaints  OBJECTIVE: BP 108/66  Pulse 57  Temp(Src) 98.5 F (36.9 C) (Oral)  Resp 18  Ht 5\' 10"  (1.778 m)  Wt 123.5 kg (272 lb 4.3 oz)  BMI 39.07 kg/m2  SpO2 94%  Intake/Output Summary (Last 24 hours) at 02/04/11 1117 Last data filed at 02/04/11 1048  Gross per 24 hour  Intake    683 ml  Output    450 ml  Net    233 ml                      Weight change: 0.3 kg (10.6 oz) Physical Exam: General: Alert and awake oriented x3 not in any acute distress. HEENT: anicteric sclera, pupils equal reactive to light and accommodation CVS: S1-S2 heard, no murmur rubs or gallops Chest: clear to auscultation bilaterally, no wheezing rales or rhonchi Abdomen:  normal bowel sounds, soft, nontender, nondistended, no organomegaly Neuro: Cranial nerves II-XII intact, no focal neurological deficits Extremities: no cyanosis, no clubbing or edema noted bilaterally   Lab Results:  [facility] 02/03/11 0644 02/02/11 0645  NA 140 137  K 4.1 4.6  CL 98 99  CO2 34* 31  GLUCOSE 143* 175*  BUN 23 18  CREATININE 1.19 1.00  CALCIUM 9.2 8.9  MG -- --  PHOS -- --    Basename 02/03/11 0644 02/02/11 0645 02/01/11 1534  WBC 7.1 8.5 --  NEUTROABS -- -- 5.0  HGB 12.6* 12.9* --  HCT 39.5 39.1 --  MCV 91.4 89.5 --  PLT 166 189 --    Basename 02/01/11 1842 02/01/11 1523  CKTOTAL 40 42  CKMB 3.0 2.8  CKMBINDEX -- --  TROPONINI <0.30 <0.30   Studies/Results: Ct Head Wo Contrast  02/01/2011  *RADIOLOGY REPORT*  Clinical Data: Generalized weakness for 6 hours  CT HEAD WITHOUT CONTRAST  Technique:  Contiguous axial images were obtained from the base of the skull through the vertex without contrast.  Comparison: 02/17/2007  Findings:  There is diffuse patchy low density throughout the subcortical and periventricular white matter consistent with chronic small vessel  ischemic change.  There is prominence of the sulci and ventricles consistent with brain atrophy.  The ventricular volumes are increased.  These appear similar in volume to the previous exam.  Right frontal lobe encephalomalacia is identified which is also unchanged from previous exam.  There is no evidence for acute brain infarct, hemorrhage or mass.  The paranasal sinuses are clear.  The mastoid air cells are clear. The skull appears intact.  IMPRESSION:  1.  Small vessel ischemic change and brain atrophy. 2.  Persistent ventriculomegaly.  Cannot rule out normal pressure hydrocephalus.  Original Report Authenticated By: Rosealee Albee, M.D.   Mr Brain Wo Contrast  02/02/2011  *RADIOLOGY REPORT*  Clinical Data: Failure to thrive.  Weakness.  Tremor.  Confusion. Right-sided stroke 2004.  MRI HEAD WITHOUT CONTRAST  Technique:  Multiplanar, multiecho pulse sequences of the brain and surrounding structures were obtained according to standard protocol without intravenous contrast.  Comparison: 02/01/2011 CT.  06/13/2003 MR.  Findings: No acute infarct.  Global atrophy most notable involving the temporal lobes.  Mild progression of ventricular prominence suggestive of hydrocephalus.  No obstructing lesions seen at the level of  the aqueduct and this may represent normal pressure hydrocephalus.  Remote posterior right frontal lobe infarct with encephalomalacia and blood breakdown products.  On series 6 image 20, slight altered signal intensity of cerebrospinal fluid within the sulci.  It is possible this is related to artifact from motion or partial volume averaging. Similar type findings can be seen in patients who are receiving supplemental oxygen.  Result of subarachnoid hemorrhage or proteinaceous material such as seen with meningitis not entirely excluded although there are no other findings on the remaining images to suggest such.  Prominent white matter type changes consistent with result of small vessel  disease.  Subependymal reabsorption of fluid not entirely excluded contributing to the periventricular white matter type changes.  No intracranial mass lesion detected on this unenhanced exam.  Major intracranial vascular structures are patent.  Minimal paranasal sinus mucosal thickening.  IMPRESSION: No acute infarct.  Remote posterior right frontal lobe infarct.  Mild progression of ventricular prominence may represent a normal pressure hydrocephalus.  Prominent white matter type changes consistent with result of small vessel disease.  Global atrophy most notable temporal lobes.  Question artifact causing altered signal intensity of cerebrospinal fluid superior convexity on FLAIR sequence as discussed above.  Results were discussed with Dr. Arthor Captain 02/02/2011 11:55 a.m.  Original Report Authenticated By: Fuller Canada, M.D.   Dg Chest Portable 1 View  02/01/2011  *RADIOLOGY REPORT*  Clinical Data: Shortness of breath, weakness and cough.  PORTABLE CHEST - 1 VIEW  Comparison: 03/17/2007.  Findings: Shortness of breath, weakness and cough. Trachea is midline.  Heart is enlarged, stable.  Lungs are low in volume with probable mild scarring at the right lung base.  No pleural fluid.  IMPRESSION: Low lung volumes without acute finding.  Original Report Authenticated By: Reyes Ivan, M.D.   Medications: Scheduled Meds:    . ARIPiprazole  15 mg Oral Daily  . carvedilol  12.5 mg Oral Q breakfast  . divalproex  500 mg Oral Daily  . donepezil  10 mg Oral QHS  . DULoxetine  120 mg Oral Daily  . furosemide  20 mg Oral QPM  . furosemide  40 mg Oral Q0700  . insulin detemir  50 Units Subcutaneous QHS  . lisinopril  5 mg Oral Daily  . pantoprazole  40 mg Oral Q1200  . sodium chloride  3 mL Intravenous Q12H  . warfarin  7.5 mg Oral ONCE-1800  . warfarin  7.5 mg Oral ONCE-1800  . DISCONTD: warfarin  5 mg Oral q1800   Continuous Infusions:  PRN Meds:.sodium chloride, acetaminophen, acetaminophen,  oxyCODONE-acetaminophen, sodium chloride  ASSESSMENT & PLAN: Active Problems:  DM  OBSTRUCTIVE SLEEP APNEA  HYPERTENSION  CHF (congestive heart failure)  Atrial fibrillation  CVA (cerebral infarction)  Alzheimer's dementia  Bipolar 1 disorder  Failure to thrive  Hyperkalemia  1. Altered mental status: worsening confusion, patient's son mentioned that his symptoms started about 4 weeks ago after he contracted of aspiration pneumonia. He was treated for that appropriately with antibiotics. N neurology is following. Normal B12, folate and nonreactive RPR, the sedimentation rate is 23. Recommended to in discontinued he oxycodone and Xanax into night time dose of Abilify and Cymbalta. Patient probably might benefit from reevaluation for sleep apnea. This is usually will be done as outpatient. We'll forward this to his primary care provider. EEG to be done today and if it's negative patient per neurology will be appropriate for discharge in the morning.  2. Tremors, resting: He  was reported to be ambulatory about 4-5 weeks ago.   3. Chronic CHF, seems to be diastolic: Last echocardiogram was done in 2008 showed ejection fraction of 65%, continue the Lasix for now. Does not exhibit any symptoms or signs of decompensation, patient is on the room air.  4. Failure to thrive and deconditioning: From the son's story patient deconditioning happened after pneumonia. We'll as physical therapy and individual therapy to evaluate the patient. Patient is a nursing home resident and probably will go back there.  5. Atrial fibrillation: This is rate controlled patient is on warfarin continue in pharmacy to dose the Coumadin.  6. Diabetes mellitus type 2: Continue insulin sliding scale and carbohydrate modified diet.    LOS: 3 days   Romilda Proby A 02/04/2011, 11:17 AM

## 2011-02-04 NOTE — Progress Notes (Signed)
Physical Therapy Evaluation Patient Details Name: [name] MRN: [id number] DOB: 09-23-1932 Today's Date: 02/04/2011  Problem List:  Patient Active Problem List  Diagnoses  . DM  . OBSTRUCTIVE SLEEP APNEA  . HYPERTENSION  . CHF (congestive heart failure)  . Atrial fibrillation  . CVA (cerebral infarction)  . Alzheimer's dementia  . Bipolar 1 disorder  . Failure to thrive  . Hyperkalemia    Past Medical History:  Past Medical History  Diagnosis Date  . Hypertension   . CHF (congestive heart failure)     H/o right sided CHF   . Arthritis   . Alzheimer's dementia   . Bipolar 1 disorder   . PVD (peripheral vascular disease)   . DVT (deep venous thrombosis)   . Atrial fibrillation   . CVA (cerebral infarction) 2004    right sided  . Dyslipidemia   . Diabetes mellitus   . Venous stasis ulcers    Past Surgical History: History reviewed. No pertinent past surgical history.  PT Assessment/Plan/Recommendation PT Assessment Clinical Impression Statement: Pt is a 77 y/o male who was admitted for AMS and difficulty breathing.  Pt with history of right CVA limiting overall mobility and cognition.  Pt will benefit from acute PT services to improve overall mobility and prepare for safe d/c to next venue.     PT Recommendation/Assessment: Patient will need skilled PT in the acute care venue PT Problem List: Decreased strength;Decreased range of motion;Decreased activity tolerance;Decreased balance;Decreased mobility PT Therapy Diagnosis : Generalized weakness PT Plan PT Frequency: Min 2X/week PT Treatment/Interventions: Functional mobility training;Therapeutic activities;Therapeutic exercise;Balance training;Neuromuscular re-education;Patient/family education PT Recommendation Follow Up Recommendations: Skilled nursing facility (If family refuses SNF then recommend HHPT 24 hr (A)) Equipment Recommended: Other (comment);Defer to next venue (if d/c home will need hospital bed  and lift) PT Goals  Acute Rehab PT Goals PT Goal Formulation: With patient Time For Goal Achievement: 2 weeks Pt will Roll Supine to Right Side: with mod assist PT Goal: Rolling Supine to Right Side - Progress: Progressing toward goal Pt will Roll Supine to Left Side: with mod assist PT Goal: Rolling Supine to Left Side - Progress: Progressing toward goal Pt will go Supine/Side to Sit: with mod assist;with rail PT Goal: Supine/Side to Sit - Progress: Progressing toward goal Pt will Sit at Edge of Bed: with mod assist;3-5 min;with bilateral upper extremity support PT Goal: Sit at Edge Of Bed - Progress: Progressing toward goal Pt will go Sit to Supine/Side: with mod assist PT Goal: Sit to Supine/Side - Progress: Progressing toward goal  PT Evaluation Precautions/Restrictions  Precautions Precautions: Fall Prior Functioning  Home Living Bathroom Shower/Tub:  (small step over) Additional Comments: Pt was at [facility] prior to admission. Prior Function Level of Independence: Needs assistance with gait;Needs assistance with tranfers;Needs assistance with ADLs Bath: Total Toileting: Total Dressing: Total Grooming: Moderate Able to Take Stairs?: No Driving: No Cognition Cognition Arousal/Alertness: Awake/alert Overall Cognitive Status: Appears within functional limits for tasks assessed Orientation Level: Oriented to person;Oriented to place Sensation/Coordination   Extremity Assessment RLE Assessment RLE Assessment: Exceptions to [facility] 2 RLE Strength RLE Overall Strength: Deficits RLE Overall Strength Comments: At least 2/5 gross LLE Assessment LLE Assessment: Exceptions to [facility] LLE Strength LLE Overall Strength: Deficits LLE Overall Strength Comments: At least 2+/5 gross Mobility (including Balance) Bed Mobility Bed Mobility: Yes Rolling Right: 1: +2 Total assist;Patient percentage (comment);With rail (pt 50%) Rolling Right Details (indicate cue type and reason): Pt able  to initiate roll  but needed (A) to complete roll to clean after urince incontinence. Rolling Left: 1: +2 Total assist;Patient percentage (comment);With rail (pt 50%) Rolling Left Details (indicate cue type and reason): Pt needed (A) to complete roll and VCs for hand placement and proper technique. Right Sidelying to Sit: 1: +2 Total assist;Patient percentage (comment);With rails (Pt 20%) Right Sidelying to Sit Details (indicate cue type and reason): Pt needed (A) to elevate trunk OOB and LE OOB.  VCs for hand placement and proper technique. Sit to Supine - Right: 1: +2 Total assist;Patient percentage (comment);Other (comment) (pt 10%) Sit to Supine - Right Details (indicate cue type and reason): (A) with LE back into bed and (A) to slowly lower trunk into bed.  Balance Balance Assessed: Yes Static Sitting Balance Static Sitting - Balance Support: No upper extremity supported Static Sitting - Level of Assistance: 1: +2 Total assist;Patient percentage (comment) (Pt 25%) Static Sitting - Comment/# of Minutes: Pt needed occasional +2 (A) to maintain balance and prevent posterior lean.  Pt tends to lean to right posterior heavily.   Exercise    End of Session PT - End of Session Activity Tolerance: Patient limited by fatigue Patient left: in bed;with call bell in reach Nurse Communication: Need for lift equipment General Behavior During Session: [facility] for tasks performed Cognition: [facility] for tasks performed  Charisa Twitty 02/04/2011, 1:16 PM [phone]

## 2011-02-05 LAB — GLUCOSE, CAPILLARY: Glucose-Capillary: 148 mg/dL — ABNORMAL HIGH (ref 70–99)

## 2011-02-05 MED ORDER — WARFARIN SODIUM 5 MG PO TABS
5.0000 mg | ORAL_TABLET | Freq: Once | ORAL | Status: DC
  Filled 2011-02-05: qty 1

## 2011-02-05 MED ORDER — OXYCODONE-ACETAMINOPHEN 5-325 MG PO TABS: 1.0000 | ORAL_TABLET | ORAL | Status: DC

## 2011-02-05 NOTE — Discharge Summary (Signed)
HOSPITAL DISCHARGE SUMMARY  [name]  MRN: [id number]  DOB:1932-12-20  Date of Admission: 02/01/2011 Date of Discharge: 02/05/2011         LOS: 4 days   Attending Physician:Isra Lindy A  Patient's [name], Peter Curt, MD, MD  Consults:   Neurology Dr. Thana Farr  Discharge Diagnoses: Present on Admission:  .Hyperkalemia .Alzheimer's dementia .Bipolar 1 disorder .CVA (cerebral infarction) .Atrial fibrillation .CHF (congestive heart failure) .OBSTRUCTIVE SLEEP APNEA .DM .HYPERTENSION   Current Discharge Medication List    CONTINUE these medications which have CHANGED   Details  oxyCODONE-acetaminophen (PERCOCET) 5-325 MG per tablet Take 1-2 tablets by mouth See admin instructions. TAKE 1 TABLET EVERY NIGHT. MAY TAKE 1 TABLET EVERY 4 HOURS AS NEEDED FOR MODERATE PAIN. TAKE 2 TABS EVERY 4 HOURS AS NEEDED FOR SEVERE PAIN. Qty: 30 tablet, Refills: 0      CONTINUE these medications which have NOT CHANGED   Details  ARIPiprazole (ABILIFY) 15 MG tablet Take 15 mg by mouth daily.      carboxymethylcellulose (REFRESH TEARS) 0.5 % SOLN Place 2 drops into both eyes every 2 (two) hours as needed. FOR DRY EYES     carvedilol (COREG) 12.5 MG tablet Take 12.5 mg by mouth daily. TAKE WITH FOOD     divalproex (DEPAKOTE ER) 500 MG 24 hr tablet Take 500 mg by mouth daily.      donepezil (ARICEPT) 10 MG tablet Take 10 mg by mouth at bedtime.      DULoxetine (CYMBALTA) 60 MG capsule Take 120 mg by mouth daily.      !! furosemide (LASIX) 20 MG tablet Take 20 mg by mouth every evening. TAKE AT 1700 DAILY     !! furosemide (LASIX) 40 MG tablet Take 40 mg by mouth every morning.      insulin detemir (LEVEMIR) 100 UNIT/ML injection Inject 50 Units into the skin at bedtime.      lisinopril (PRINIVIL,ZESTRIL) 5 MG tablet Take 5 mg by mouth daily.      metFORMIN (GLUCOPHAGE-XR) 500 MG 24 hr tablet Take 500 mg by mouth 2 (two) times daily with a meal.      omeprazole  (PRILOSEC) 20 MG capsule Take 20 mg by mouth daily.      senna-docusate (SENOKOT-S) 8.6-50 MG per tablet Take 1 tablet by mouth 2 (two) times daily.      warfarin (JANTOVEN) 5 MG tablet Take 5 mg by mouth daily.      zinc oxide 20 % ointment Apply 1 application topically as needed. APPLY TO BUTTOCKS AS NEEDED FOR BLANCHABLE  REDNESS.      !! - Potential duplicate medications found. Please discuss with provider.    STOP taking these medications     ALPRAZolam (XANAX) 0.25 MG tablet          Brief Admission History: 78yoM with h/o severe sleep apnea, right sided CVA 2004 currently bedbound, chronic AFib/Coumadin, right sided CHF, mild dementia. Per history from daughter and son at bedside, the patient had a right sided CVA in 2004 and since then did recover a   overall has been bed bound for the past 3 yrs. However his baseline up until a month ago was mental sharpness and ability to joke around, knew where he was and the name of the facility, and knew that his wife had passed away. Not able to fully feed himself but could drink out of a cup by himself. Could have meaningful conversation. For the past 2-4 weeks however, he's  had an acute decline in his mental status. He appears really lethargic, doesn't participate in conversation, and is very weak. He's too weak to even pick up a phone which he previously could do. He can't answer the phone, he can't hold a cup to drink. He's more disoriented, asking inappropriate questions like if his room was changed, doesn't know where he is. Two weeks ago a chest x-ray showed bibasilar opacities for which he got Levofloxacin, however the pt also is noted to have dysphagia and possibly aspirating. In the ED vitals were stable. Chem with K 5.3, renal 13/0.73, glucose 208. Trop negative x2. CBC normal, Hct 42.4. Ddimer negative. UA negative other than 15 ketonuria, 30 proteinuria. CT head showed small vessel disease, brain atrophy, persistent ventriculomegaly,  cannot r/o NPH, and unchanged right frontal lobe encephalomalacia. CXR with low lung volumes, no acute findings. Pt was given morphine, zofran, kayexalate.   Hospital Course: Present on Admission:  .Hyperkalemia .Alzheimer's dementia .Bipolar 1 disorder .CVA (cerebral infarction) .Atrial fibrillation .CHF (congestive heart failure) .OBSTRUCTIVE SLEEP APNEA .DM .HYPERTENSION  1. Altered mental status: Patient family mentioned altered mental status with increased daytime sleepiness. Increased confusion and top of his dementia. After patient admitted to the hospital patient evaluated by CT scan and MRI which both showed no acute findings with persistent ventriculomegaly which is been stable for at least the past 3 years. Patient also evaluated by neurology which they be ruled out acute processes by normal RPR, TSH, folate. Also EEG without acute changes. The daytime sleepiness and confusion thought to be multifactorial. Patient does have sleep apnea, hypercapnia, medication and a slow recovery from pneumonia bite all contributed to his functional and mental status. Patient mental status back to baseline per his son. But he still very deconditioned. Patient to followup with PT/OT in the nursing home. There was a mention that patient was on 10 mg of Ambien at nighttime it was not in his medication profile. But to recommend to be very careful with sedative medications as patient has sleep apnea also. Patient's son was questioning the necessity of Depakote, Dr. Thad Ranger from neurology recommended not to stop the Depakote for now. Be careful with other sedatives including benzodiazepines and narcotics.  2. Chronic CHF, likely diastolic: This is been without acute exacerbation in the hospital here his Lasix was continued throughout the hospital stay.  3. Diabetes mellitus type 2: no changes were done to his regimen, blood sugar control was reasonable during this hospital stay.  4. Atrial fibrillation:  patient did have history of 2 strokes before. His atrial fibrillation is rate controlled. Patient is on Coumadin his INR being therapeutic here in the hospital.  5. OSA: Patient is known to have OSA, her son last study was about 3-4 years ago. Sleep study cannot be done as inpatient so it's recommended to do one as outpatient to put patient back on CPAP.  Day of Discharge BP 92/62  Pulse 79  Temp(Src) 98 F (36.7 C) (Oral)  Resp 20  Ht 5\' 10"  (1.778 m)  Wt 123 kg (271 lb 2.7 oz)  BMI 38.91 kg/m2  SpO2 95% Physical Exam: GEN: No acute distress, cooperative with exam PSYCH: He is alert and oriented x4; does not appear anxious does not appear depressed; affect is normal  HEENT: Mucous membranes pink and anicteric;  Mouth: without oral thrush or lesions Eyes: PERRLA; EOM intact;  Neck: no cervical lymphadenopathy nor thyromegaly or carotid bruit; no JVD;  CHEST WALL: No tenderness, symmetrical to breathing  bilaterally CHEST: Normal respiration, clear to auscultation bilaterally  HEART: Regular rate and rhythm; no murmurs, rubs or gallops, S1 and S2 heard  BACK: No kyphosis or scoliosis; no CVA tenderness  ABDOMEN:  soft non-tender; no masses, no organomegaly, normal abdominal bowel sounds; no pannus; no intertriginous candida.  EXTREMITIES: No bone or joint deformity; no edema; no ulcerations.  PULSES: 2+ and symmetric, neurovascularity is intact SKIN: Normal hydration no rash or ulceration, no flushing or suspicious lesions  CNS: Cranial nerves 2-12 grossly intact no focal neurologic deficit, coordination is intact gait not tested    Results for orders placed during the hospital encounter of 02/01/11 (from the past 24 hour(s))  GLUCOSE, CAPILLARY     Status: Abnormal   Collection Time   02/04/11 11:09 AM      Component Value Range   Glucose-Capillary 183 (*) 70 - 99 (mg/dL)   Comment 1 Notify RN     Comment 2 Documented in Chart    GLUCOSE, CAPILLARY     Status: Abnormal    Collection Time   02/04/11  3:51 PM      Component Value Range   Glucose-Capillary 189 (*) 70 - 99 (mg/dL)  GLUCOSE, CAPILLARY     Status: Abnormal   Collection Time   02/04/11  9:13 PM      Component Value Range   Glucose-Capillary 248 (*) 70 - 99 (mg/dL)  GLUCOSE, CAPILLARY     Status: Abnormal   Collection Time   02/05/11  6:21 AM      Component Value Range   Glucose-Capillary 148 (*) 70 - 99 (mg/dL)  PROTIME-INR     Status: Abnormal   Collection Time   02/05/11  6:50 AM      Component Value Range   Prothrombin Time 18.7 (*) 11.6 - 15.2 (seconds)   INR 1.53 (*) 0.00 - 1.49     Disposition: SNF Special instruction:   Please do not use straws when patient is drinking.  PT/OT/SLP for swallow evaluation to followup in the nursing home   Follow-up Appts: Discharge Orders    Future Orders Please Complete By Expires   Diet - low sodium heart healthy      Increase activity slowly         Follow-up Information    Follow up with GREEN, Peter Curt, MD in 1 week.   Contact information:   [address] [place] [address] [phone]          I spent 40 minutes completing paperwork and coordinating discharge efforts.  SignedClydia Llano A 02/05/2011, 10:46 AM

## 2011-02-05 NOTE — Progress Notes (Signed)
ANTICOAGULATION CONSULT NOTE - Follow Up Consult  Pharmacy Consult for Coumadin  Indication: atrial fibrillation  Assessment: 80 YOM on coumadin PTA for Afib. INR remains subtherapeutic but up to 1.55 from 1.18 yesterday with 7.5mg  x 2 days. No bleeding noted. No CBC since 12/16.   Goal of Therapy:  INR 2-3   Plan:  1. Coumadin 5mg  po x 1 today  2. F/u INR in AM   Thank you,  Brett Fairy, PharmD    Allergies  Allergen Reactions  . Penicillins Shortness Of Breath    Per MAR. "Throat closes up" per daughter     Patient Measurements: Height: 5\' 10"  (177.8 cm) Weight: 271 lb 2.7 oz (123 kg) (bedscale) IBW/kg (Calculated) : 73    Vital Signs: Temp: 98 F (36.7 C) (12/18 0700) BP: 92/62 mmHg (12/18 0700) Pulse Rate: 79  (12/18 0700)  Labs:  Basename 02/05/11 0650 02/04/11 0635 02/03/11 0644  HGB -- -- 12.6*  HCT -- -- 39.5  PLT -- -- 166  APTT -- -- --  LABPROT 18.7* 15.2 15.4*  INR 1.53* 1.18 1.19  HEPARINUNFRC -- -- --  CREATININE -- -- 1.19  CKTOTAL -- -- --  CKMB -- -- --  TROPONINI -- -- --   Estimated Creatinine Clearance: 69.5 ml/min (by C-G formula based on Cr of 1.19).   Medications:  Scheduled:    . ARIPiprazole  15 mg Oral Daily  . carvedilol  12.5 mg Oral Q breakfast  . divalproex  500 mg Oral Daily  . donepezil  10 mg Oral QHS  . DULoxetine  120 mg Oral Daily  . furosemide  20 mg Oral QPM  . furosemide  40 mg Oral Q0700  . insulin detemir  50 Units Subcutaneous QHS  . lisinopril  5 mg Oral Daily  . pantoprazole  40 mg Oral Q1200  . sodium chloride  3 mL Intravenous Q12H  . warfarin  7.5 mg Oral ONCE-1800   Infusions:   PRN: sodium chloride, acetaminophen, acetaminophen, oxyCODONE-acetaminophen, sodium chloride   Raeleen Winstanley N 02/05/2011,10:26 AM

## 2011-02-05 NOTE — Progress Notes (Signed)
   CARE MANAGEMENT NOTE 02/05/2011  Patient:  [name], [name]   Account Number:  [id number]  Date Initiated:  02/04/2011  Documentation initiated by:  Donn Pierini  Subjective/Objective Assessment:   Pt admitted for FTT, weakness, confusion     Action/Plan:   PTA pt lived at [facility]   Anticipated DC Date:  02/05/2011   Anticipated DC Plan:  SKILLED NURSING FACILITY  In-house referral  Clinical Social Worker      DC Planning Services  CM consult      Choice offered to / List presented to:             Status of service:  Completed, signed off Medicare Important Message given?   (If response is "NO", the following Medicare IM given date fields will be blank) Date Medicare IM given:   Date Additional Medicare IM given:    Discharge Disposition:  SKILLED NURSING FACILITY  Per UR Regulation:  Reviewed for med. necessity/level of care/duration of stay  Comments:  02/05/11- 1200- Donn Pierini RN, BSN [phone] Pt for discharge back to [facility] today, CSW following for discharge needs  02/04/11- 1215- Donn Pierini RN, BSN [phone] Pt from [facility], PT eval pending, CSW consulted for placement needs

## 2011-02-05 NOTE — Progress Notes (Signed)
Patient for d/c today back to SNF bed at [facility]- family agreeable to plans- EMS to transport back to SNF. Reece Levy, MSW, Theresia Majors [phone]

## 2011-02-20 ENCOUNTER — Encounter (HOSPITAL_BASED_OUTPATIENT_CLINIC_OR_DEPARTMENT_OTHER): Payer: Medicare Other

## 2011-03-04 ENCOUNTER — Encounter (HOSPITAL_BASED_OUTPATIENT_CLINIC_OR_DEPARTMENT_OTHER): Payer: Medicare Other

## 2011-03-12 ENCOUNTER — Ambulatory Visit (HOSPITAL_BASED_OUTPATIENT_CLINIC_OR_DEPARTMENT_OTHER): Payer: Medicare Other | Attending: Internal Medicine

## 2011-03-12 DIAGNOSIS — G4737 Central sleep apnea in conditions classified elsewhere: Secondary | ICD-10-CM | POA: Insufficient documentation

## 2011-03-12 DIAGNOSIS — G4733 Obstructive sleep apnea (adult) (pediatric): Secondary | ICD-10-CM | POA: Insufficient documentation

## 2011-03-16 DIAGNOSIS — G4733 Obstructive sleep apnea (adult) (pediatric): Secondary | ICD-10-CM

## 2011-03-16 DIAGNOSIS — G4737 Central sleep apnea in conditions classified elsewhere: Secondary | ICD-10-CM

## 2011-03-16 NOTE — Procedures (Signed)
NAMENORRIS, [name]               ACCOUNT NO.:  [id number]  MEDICAL RECORD NO.:  [id number]          PATIENT TYPE:  OUT  LOCATION:  SLEEP CENTER                 FACILITY:  [facility]  PHYSICIAN:  Donye Campanelli D. Maple Hudson, MD, FCCP, FACPDATE OF BIRTH:  10-01-[birth year]  DATE OF STUDY:  03/12/2011                           NOCTURNAL POLYSOMNOGRAM  REFERRING PHYSICIAN:  Oneal Grout, MD  INDICATION FOR STUDY:  Hypersomnia with sleep apnea.  This was done as an unattended study at the patient's residence because the patient is confined to a bed and chair existence.  A previous diagnostic NPSG as an attended study on March 31, 2007, had recorded an AHI of 38 per hour. He had failed to tolerate CPAP. Because of excessive daytime somnolence and neurologic concerns evaluation is requested.  EPWORTH SLEEPINESS SCORE:  12/24. BMI 41, weight 284 pounds, height 5 feet 10 inches, neck 18.5 inches.  MEDICATIONS:  Home medications are charted and reviewed.  SLEEP ARCHITECTURE:  Total sleep time 478.8 minutes with lights off at 10 p.m., lights on at 5:59 a.m.  Sleep architecture is not recorded in this format.  RESPIRATORY DATA:  Apnea-hypopnea index (AHI) 52.5 per hour. A total of 419 events was scored including 108 obstructive apneas, 85 central apneas, 92 mixed apneas, 134 hypopneas.  OXYGEN DATA:  Moderate snoring represented 0.3% of recording time.  Mean heart rate 61.7 per minute with maximum heart rate 112 beats per minute. A total of 10 percent of time in bed was recorded with oxygen saturation less than 85% on room air.  Supplemental oxygen use is not indicated in the test description during this study.  CARDIAC DATA:  MOVEMENT-PARASOMNIA:  IMPRESSIONS-RECOMMENDATIONS: 1. Severe obstructive and central sleep apnea/hypopnea syndrome, AHI     52.5 per hour, with moderate snoring and oxygen desaturation to a     nadir of 64% and a total of 48.1 minutes during the recording with     room air  saturation less than 85%. 2. Consider CPAP auto titration through a durable medical equipment     company for another trial of CPAP. 3. Encourage sleep off flat of back. 4. At a minimum, supplemental oxygen during sleep appears advisable if     CO2 retention can be avoided.     Cyntha Brickman D. Maple Hudson, MD, [facility], FACP Diplomate, Biomedical engineer of Sleep Medicine Electronically Signed    CDY/MEDQ  D:  03/16/2011 14:07:52  T:  03/16/2011 20:27:52  Job:  [id number]

## 2012-05-18 ENCOUNTER — Other Ambulatory Visit: Payer: Self-pay | Admitting: *Deleted

## 2012-05-19 ENCOUNTER — Non-Acute Institutional Stay (SKILLED_NURSING_FACILITY): Payer: Medicare Other | Admitting: Nurse Practitioner

## 2012-05-19 DIAGNOSIS — I6350 Cerebral infarction due to unspecified occlusion or stenosis of unspecified cerebral artery: Secondary | ICD-10-CM

## 2012-05-19 DIAGNOSIS — I82409 Acute embolism and thrombosis of unspecified deep veins of unspecified lower extremity: Secondary | ICD-10-CM

## 2012-05-19 DIAGNOSIS — I639 Cerebral infarction, unspecified: Secondary | ICD-10-CM

## 2012-05-19 DIAGNOSIS — I4891 Unspecified atrial fibrillation: Secondary | ICD-10-CM

## 2012-05-19 DIAGNOSIS — Z7901 Long term (current) use of anticoagulants: Secondary | ICD-10-CM

## 2012-05-22 ENCOUNTER — Encounter: Payer: Self-pay | Admitting: Nurse Practitioner

## 2012-05-22 NOTE — Progress Notes (Signed)
Patient ID: [name], male   DOB: [birth year], 79 y.o.   MRN: [id number] Subjective:     Indication: atrial fibrillation and CVA Bleeding signs/symptoms: None Thromboembolic signs/symptoms: None  Missed Coumadin doses: None Medication changes: no Dietary changes: no Bacterial/viral infection: no Other concerns: no  The following portions of the patient's history were reviewed and updated as appropriate: allergies, current medications, past family history, past medical history, past social history, past surgical history and problem list.  Review of Systems A comprehensive review of systems was negative.   Objective:    INR Today: 2.7 Current dose: 6mg   Assessment:    Therapeutic INR for goal of 2-3   Plan:    1. New dose: no change   2. Next INR: 2 weeks

## 2012-06-01 ENCOUNTER — Non-Acute Institutional Stay (SKILLED_NURSING_FACILITY): Payer: Medicare Other | Admitting: Nurse Practitioner

## 2012-06-01 DIAGNOSIS — I4891 Unspecified atrial fibrillation: Secondary | ICD-10-CM

## 2012-06-01 DIAGNOSIS — Z7901 Long term (current) use of anticoagulants: Secondary | ICD-10-CM

## 2012-06-01 DIAGNOSIS — I6350 Cerebral infarction due to unspecified occlusion or stenosis of unspecified cerebral artery: Secondary | ICD-10-CM

## 2012-06-01 DIAGNOSIS — I639 Cerebral infarction, unspecified: Secondary | ICD-10-CM

## 2012-06-01 DIAGNOSIS — I82409 Acute embolism and thrombosis of unspecified deep veins of unspecified lower extremity: Secondary | ICD-10-CM

## 2012-06-02 ENCOUNTER — Non-Acute Institutional Stay (SKILLED_NURSING_FACILITY): Payer: Medicare Other | Admitting: Nurse Practitioner

## 2012-06-02 DIAGNOSIS — I4891 Unspecified atrial fibrillation: Secondary | ICD-10-CM

## 2012-06-02 DIAGNOSIS — I82409 Acute embolism and thrombosis of unspecified deep veins of unspecified lower extremity: Secondary | ICD-10-CM

## 2012-06-02 DIAGNOSIS — Z7901 Long term (current) use of anticoagulants: Secondary | ICD-10-CM

## 2012-06-02 DIAGNOSIS — I639 Cerebral infarction, unspecified: Secondary | ICD-10-CM

## 2012-06-02 DIAGNOSIS — I6350 Cerebral infarction due to unspecified occlusion or stenosis of unspecified cerebral artery: Secondary | ICD-10-CM

## 2012-06-04 ENCOUNTER — Non-Acute Institutional Stay (SKILLED_NURSING_FACILITY): Payer: Medicare Other | Admitting: Nurse Practitioner

## 2012-06-04 DIAGNOSIS — I639 Cerebral infarction, unspecified: Secondary | ICD-10-CM

## 2012-06-04 DIAGNOSIS — I82409 Acute embolism and thrombosis of unspecified deep veins of unspecified lower extremity: Secondary | ICD-10-CM

## 2012-06-04 DIAGNOSIS — I4891 Unspecified atrial fibrillation: Secondary | ICD-10-CM

## 2012-06-04 DIAGNOSIS — Z7901 Long term (current) use of anticoagulants: Secondary | ICD-10-CM

## 2012-06-04 DIAGNOSIS — I6350 Cerebral infarction due to unspecified occlusion or stenosis of unspecified cerebral artery: Secondary | ICD-10-CM

## 2012-06-08 ENCOUNTER — Non-Acute Institutional Stay (SKILLED_NURSING_FACILITY): Payer: Medicare Other | Admitting: Nurse Practitioner

## 2012-06-08 DIAGNOSIS — I639 Cerebral infarction, unspecified: Secondary | ICD-10-CM

## 2012-06-08 DIAGNOSIS — I82409 Acute embolism and thrombosis of unspecified deep veins of unspecified lower extremity: Secondary | ICD-10-CM

## 2012-06-08 DIAGNOSIS — I6350 Cerebral infarction due to unspecified occlusion or stenosis of unspecified cerebral artery: Secondary | ICD-10-CM

## 2012-06-08 DIAGNOSIS — I4891 Unspecified atrial fibrillation: Secondary | ICD-10-CM

## 2012-06-08 DIAGNOSIS — Z7901 Long term (current) use of anticoagulants: Secondary | ICD-10-CM

## 2012-06-10 ENCOUNTER — Encounter: Payer: Self-pay | Admitting: Nurse Practitioner

## 2012-06-10 MED ORDER — WARFARIN SODIUM 5 MG PO TABS: 5.0000 mg | ORAL_TABLET | Freq: Every day | ORAL | Status: DC

## 2012-06-10 NOTE — Progress Notes (Signed)
Patient ID: [name], male   DOB: 17-Aug-1932, 79 y.o.   MRN: [id number] Subjective:     Indication: atrial fibrillation Bleeding signs/symptoms: None Thromboembolic signs/symptoms: None  Missed Coumadin doses: None Medication changes: no Dietary changes: no Bacterial/viral infection: no Other concerns: no  The following portions of the patient's history were reviewed and updated as appropriate: allergies, current medications, past family history, past medical history, past social history, past surgical history and problem list.  Review of Systems A comprehensive review of systems was negative.   Objective:    INR Today: 3.1 Current dose: 6mg   Assessment:    Supratherapeutic INR for goal of 2-3   Plan:    1. New dose: hold   2. Next INR: am

## 2012-06-10 NOTE — Progress Notes (Signed)
Patient ID: [name], male   DOB: September 28, 1932, 79 y.o.   MRN: [id number] Subjective:     Indication: atrial fibrillation and CVA Bleeding signs/symptoms: None Thromboembolic signs/symptoms: None  Missed Coumadin doses: None Medication changes: no Dietary changes: no Bacterial/viral infection: no Other concerns: no  The following portions of the patient's history were reviewed and updated as appropriate: allergies, current medications, past family history, past medical history, past social history, past surgical history and problem list.  Review of Systems A comprehensive review of systems was negative.   Objective:    INR Today: 2.9 Current dose: 6 mg was held  Assessment:    Therapeutic INR for goal of 2-3   Plan:    1. New dose: decrease to 5mg    2. Next INR: Thursday

## 2012-06-10 NOTE — Progress Notes (Signed)
Patient ID: [name], male   DOB: 1933-01-06, 79 y.o.   MRN: [id number] Subjective:     Indication: atrial fibrillation and CVA Bleeding signs/symptoms: None Thromboembolic signs/symptoms: None  Missed Coumadin doses: None Medication changes: no Dietary changes: no Bacterial/viral infection: no Other concerns: no  The following portions of the patient's history were reviewed and updated as appropriate: allergies, current medications, past family history, past medical history, past social history, past surgical history and problem list.  Review of Systems A comprehensive review of systems was negative.   Objective:    INR Today: 2.0 Current dose: 5mg   Assessment:    Therapeutic INR for goal of 2-3   Plan:    1. New dose: no change   2. Next INR: Monday

## 2012-06-10 NOTE — Progress Notes (Signed)
Patient ID: [name], male   DOB: 1932/11/20, 79 y.o.   MRN: [id number] Subjective:     Indication: atrial fibrillation and CVA Bleeding signs/symptoms: None Thromboembolic signs/symptoms: None  Missed Coumadin doses: None Medication changes: no Dietary changes: no Bacterial/viral infection: no Other concerns: no  The following portions of the patient's history were reviewed and updated as appropriate: allergies, current medications, past family history, past medical history, past social history, past surgical history and problem list.  Review of Systems A comprehensive review of systems was negative.   Objective:    INR Today: 2.0 Current dose: 5mg   Assessment:    Therapeutic INR for goal of 2-3   Plan:    1. New dose: no change   2. Next INR: 1 week

## 2012-06-16 ENCOUNTER — Non-Acute Institutional Stay (SKILLED_NURSING_FACILITY): Payer: Medicare Other | Admitting: Nurse Practitioner

## 2012-06-16 ENCOUNTER — Encounter: Payer: Self-pay | Admitting: Nurse Practitioner

## 2012-06-16 DIAGNOSIS — I82409 Acute embolism and thrombosis of unspecified deep veins of unspecified lower extremity: Secondary | ICD-10-CM

## 2012-06-16 DIAGNOSIS — Z7901 Long term (current) use of anticoagulants: Secondary | ICD-10-CM

## 2012-06-16 DIAGNOSIS — I4891 Unspecified atrial fibrillation: Secondary | ICD-10-CM

## 2012-06-16 DIAGNOSIS — I639 Cerebral infarction, unspecified: Secondary | ICD-10-CM

## 2012-06-16 DIAGNOSIS — I6350 Cerebral infarction due to unspecified occlusion or stenosis of unspecified cerebral artery: Secondary | ICD-10-CM

## 2012-06-16 NOTE — Progress Notes (Signed)
This encounter was created in error - please disregard.

## 2012-06-16 NOTE — Progress Notes (Signed)
Patient ID: [name], male   DOB: 1933/02/03, 79 y.o.   MRN: [id number] Subjective:     Indication: atrial fibrillation, CVA and DVT Bleeding signs/symptoms: None Thromboembolic signs/symptoms: None  Missed Coumadin doses: None Medication changes: no Dietary changes: no Bacterial/viral infection: no Other concerns: no  The following portions of the patient's history were reviewed and updated as appropriate: allergies, current medications, past family history, past medical history, past social history, past surgical history and problem list.  Review of Systems A comprehensive review of systems was negative.   Objective:    INR Today: 1.7 Current dose: 5mg   Assessment:    Subtherapeutic INR for goal of 2-3   Plan:    1. New dose: no change   2. Next INR: 06/17/12

## 2012-06-17 ENCOUNTER — Encounter: Payer: Self-pay | Admitting: Nurse Practitioner

## 2012-06-17 ENCOUNTER — Non-Acute Institutional Stay (SKILLED_NURSING_FACILITY): Payer: Medicare Other | Admitting: Nurse Practitioner

## 2012-06-17 DIAGNOSIS — I82409 Acute embolism and thrombosis of unspecified deep veins of unspecified lower extremity: Secondary | ICD-10-CM

## 2012-06-17 DIAGNOSIS — Z7901 Long term (current) use of anticoagulants: Secondary | ICD-10-CM

## 2012-06-17 DIAGNOSIS — I639 Cerebral infarction, unspecified: Secondary | ICD-10-CM

## 2012-06-17 DIAGNOSIS — I6350 Cerebral infarction due to unspecified occlusion or stenosis of unspecified cerebral artery: Secondary | ICD-10-CM

## 2012-06-17 DIAGNOSIS — I4891 Unspecified atrial fibrillation: Secondary | ICD-10-CM

## 2012-06-17 NOTE — Progress Notes (Signed)
Patient ID: [name], male   DOB: 1932-09-27, 79 y.o.   MRN: [id number] Subjective:     Indication: atrial fibrillation, CVA and DVT Bleeding signs/symptoms: None Thromboembolic signs/symptoms: None  Missed Coumadin doses: None Medication changes: no Dietary changes: no Bacterial/viral infection: no Other concerns: no  The following portions of the patient's history were reviewed and updated as appropriate: allergies, current medications, past family history, past medical history, past social history, past surgical history and problem list.  Review of Systems A comprehensive review of systems was negative.   Objective:    INR Today: 1.6 Current dose: 5mg   Assessment:    Subtherapeutic INR for goal of 2-3   Plan:    1. New dose: 6 mg   2. Next INR: 06/23/11

## 2012-06-23 ENCOUNTER — Other Ambulatory Visit: Payer: Self-pay | Admitting: Geriatric Medicine

## 2012-06-23 MED ORDER — OXYCODONE HCL 10 MG PO TB12: 10.0000 mg | ORAL_TABLET | Freq: Two times a day (BID) | ORAL | Status: DC

## 2012-06-25 ENCOUNTER — Encounter: Payer: Self-pay | Admitting: Nurse Practitioner

## 2012-06-25 NOTE — Progress Notes (Signed)
Patient ID: [name], male   DOB: 08/21/1932, 79 y.o.   MRN: [id number]  This encounter was created in error - please disregard.

## 2012-06-26 ENCOUNTER — Other Ambulatory Visit: Payer: Self-pay | Admitting: *Deleted

## 2012-06-26 MED ORDER — OXYCODONE-ACETAMINOPHEN 5-325 MG PO TABS: ORAL_TABLET | ORAL | Status: DC

## 2012-06-29 ENCOUNTER — Encounter: Payer: Self-pay | Admitting: Nurse Practitioner

## 2012-07-06 ENCOUNTER — Encounter: Payer: Self-pay | Admitting: Nurse Practitioner

## 2012-07-06 ENCOUNTER — Other Ambulatory Visit: Payer: Self-pay | Admitting: *Deleted

## 2012-07-06 MED ORDER — MORPHINE SULFATE ER 15 MG PO TBCR: EXTENDED_RELEASE_TABLET | ORAL | Status: DC

## 2012-07-14 ENCOUNTER — Non-Acute Institutional Stay (SKILLED_NURSING_FACILITY): Payer: Medicare Other | Admitting: Nurse Practitioner

## 2012-07-14 DIAGNOSIS — I639 Cerebral infarction, unspecified: Secondary | ICD-10-CM

## 2012-07-14 DIAGNOSIS — I4891 Unspecified atrial fibrillation: Secondary | ICD-10-CM

## 2012-07-14 DIAGNOSIS — I6350 Cerebral infarction due to unspecified occlusion or stenosis of unspecified cerebral artery: Secondary | ICD-10-CM

## 2012-07-14 DIAGNOSIS — Z7901 Long term (current) use of anticoagulants: Secondary | ICD-10-CM

## 2012-07-14 DIAGNOSIS — I82409 Acute embolism and thrombosis of unspecified deep veins of unspecified lower extremity: Secondary | ICD-10-CM

## 2012-07-20 MED ORDER — WARFARIN SODIUM 6 MG PO TABS: 6.0000 mg | ORAL_TABLET | Freq: Every day | ORAL | Status: DC

## 2012-07-20 NOTE — Progress Notes (Signed)
Patient ID: TIVIS WHERRY, male   DOB: December 11, 1932, 79 y.o.   MRN: [id number] Subjective:     Indication: atrial fibrillation, CVA and DVT Bleeding signs/symptoms: None Thromboembolic signs/symptoms: None  Missed Coumadin doses: None Medication changes: no Dietary changes: no Bacterial/viral infection: no Other concerns: no  The following portions of the patient's history were reviewed and updated as appropriate: allergies, current medications, past family history, past medical history, past social history, past surgical history and problem list.  Review of Systems A comprehensive review of systems was negative.   Objective:    INR Today: 2.3    Current dose: 6mg       Assessment:    Therapeutic INR for goal of 2-3   Plan:    1. New dose: no change   2. Next INR: 1 week

## 2012-07-28 ENCOUNTER — Encounter: Payer: Self-pay | Admitting: Nurse Practitioner

## 2012-08-03 ENCOUNTER — Other Ambulatory Visit: Payer: Self-pay | Admitting: Geriatric Medicine

## 2012-08-03 MED ORDER — MORPHINE SULFATE ER 15 MG PO TBCR: EXTENDED_RELEASE_TABLET | ORAL | Status: DC

## 2012-09-03 ENCOUNTER — Other Ambulatory Visit: Payer: Self-pay | Admitting: Geriatric Medicine

## 2012-09-03 MED ORDER — MORPHINE SULFATE ER 15 MG PO TBCR: EXTENDED_RELEASE_TABLET | ORAL | Status: DC

## 2012-09-09 ENCOUNTER — Non-Acute Institutional Stay (SKILLED_NURSING_FACILITY): Payer: Medicare Other | Admitting: Adult Health

## 2012-09-09 DIAGNOSIS — I509 Heart failure, unspecified: Secondary | ICD-10-CM

## 2012-09-09 DIAGNOSIS — F319 Bipolar disorder, unspecified: Secondary | ICD-10-CM

## 2012-09-09 DIAGNOSIS — Z7901 Long term (current) use of anticoagulants: Secondary | ICD-10-CM

## 2012-09-09 DIAGNOSIS — F0280 Dementia in other diseases classified elsewhere without behavioral disturbance: Secondary | ICD-10-CM

## 2012-09-09 DIAGNOSIS — I639 Cerebral infarction, unspecified: Secondary | ICD-10-CM

## 2012-09-09 DIAGNOSIS — K5900 Constipation, unspecified: Secondary | ICD-10-CM

## 2012-09-09 DIAGNOSIS — I10 Essential (primary) hypertension: Secondary | ICD-10-CM

## 2012-09-09 DIAGNOSIS — K219 Gastro-esophageal reflux disease without esophagitis: Secondary | ICD-10-CM

## 2012-09-09 DIAGNOSIS — E1149 Type 2 diabetes mellitus with other diabetic neurological complication: Secondary | ICD-10-CM

## 2012-09-09 DIAGNOSIS — G8929 Other chronic pain: Secondary | ICD-10-CM

## 2012-09-09 DIAGNOSIS — I6350 Cerebral infarction due to unspecified occlusion or stenosis of unspecified cerebral artery: Secondary | ICD-10-CM

## 2012-09-09 DIAGNOSIS — G4733 Obstructive sleep apnea (adult) (pediatric): Secondary | ICD-10-CM

## 2012-09-09 DIAGNOSIS — I4891 Unspecified atrial fibrillation: Secondary | ICD-10-CM

## 2012-09-11 ENCOUNTER — Non-Acute Institutional Stay (SKILLED_NURSING_FACILITY): Payer: Medicare Other | Admitting: Adult Health

## 2012-09-11 DIAGNOSIS — I6350 Cerebral infarction due to unspecified occlusion or stenosis of unspecified cerebral artery: Secondary | ICD-10-CM

## 2012-09-11 DIAGNOSIS — Z7901 Long term (current) use of anticoagulants: Secondary | ICD-10-CM

## 2012-09-11 DIAGNOSIS — I639 Cerebral infarction, unspecified: Secondary | ICD-10-CM

## 2012-09-11 DIAGNOSIS — I4891 Unspecified atrial fibrillation: Secondary | ICD-10-CM

## 2012-09-14 ENCOUNTER — Encounter: Payer: Self-pay | Admitting: Adult Health

## 2012-09-14 DIAGNOSIS — K5900 Constipation, unspecified: Secondary | ICD-10-CM | POA: Insufficient documentation

## 2012-09-14 DIAGNOSIS — G8929 Other chronic pain: Secondary | ICD-10-CM | POA: Insufficient documentation

## 2012-09-14 DIAGNOSIS — Z7901 Long term (current) use of anticoagulants: Secondary | ICD-10-CM | POA: Insufficient documentation

## 2012-09-14 DIAGNOSIS — E1149 Type 2 diabetes mellitus with other diabetic neurological complication: Secondary | ICD-10-CM | POA: Insufficient documentation

## 2012-09-14 DIAGNOSIS — K219 Gastro-esophageal reflux disease without esophagitis: Secondary | ICD-10-CM | POA: Insufficient documentation

## 2012-09-14 NOTE — Assessment & Plan Note (Signed)
No change in status will continue aricept 10 mg daily and will monitor his status

## 2012-09-14 NOTE — Assessment & Plan Note (Signed)
Is stable will continue levemir 48 units daily and metformin 250 mg twice daily and will monitor

## 2012-09-14 NOTE — Assessment & Plan Note (Signed)
She is stable will continue O2 and CPAP at night

## 2012-09-14 NOTE — Assessment & Plan Note (Signed)
Is on chronic coumadin therapy; heart rate is controlled

## 2012-09-14 NOTE — Assessment & Plan Note (Signed)
For his inr of 3.6 will hold coumadin 6 mg today and will check inr in the am and will monitor

## 2012-09-14 NOTE — Progress Notes (Signed)
Patient ID: [name], male   DOB: 1933-01-26, 79 y.o.   MRN: [id number]  [facility]  Allergies  Allergen Reactions  . Penicillins Shortness Of Breath    Per MAR. "Throat closes up" per daughter      Chief Complaint  Patient presents with  . Medical Managment of Chronic Issues    HPI: He is being seen for the management of his chronic illnesses. Overall his status remains unchanged. There are no concerns being voiced by the nursing staff his inr is elevated at 3.6 he will need to have his coumadin held today.   Past Medical History  Diagnosis Date  . Hypertension   . CHF (congestive heart failure)     H/o right sided CHF   . Arthritis   . Alzheimer's dementia   . Bipolar 1 disorder   . PVD (peripheral vascular disease)   . DVT (deep venous thrombosis)   . Atrial fibrillation   . CVA (cerebral infarction) 2004    right sided  . Dyslipidemia   . Diabetes mellitus   . Venous stasis ulcers     No past surgical history on file.  VITAL SIGNS BP 110/60  Pulse 64  Ht 5\' 10"  (1.778 m)  Wt 267 lb (121.11 kg)  BMI 38.31 kg/m2   Patient's Medications  New Prescriptions   No medications on file  Previous Medications   CARBOXYMETHYLCELLULOSE (REFRESH TEARS) 0.5 % SOLN    Place 2 drops into both eyes every 2 (two) hours as needed. FOR DRY EYES    CARVEDILOL (COREG) 12.5 MG TABLET    Take 12.5 mg by mouth daily. TAKE WITH FOOD    DONEPEZIL (ARICEPT) 10 MG TABLET    Take 10 mg by mouth at bedtime.     DULOXETINE (CYMBALTA) 60 MG CAPSULE    Take 90 mg by mouth daily.    FUROSEMIDE (LASIX) 20 MG TABLET    Take 20 mg by mouth daily with supper. TAKE AT 1700 DAILY   FUROSEMIDE (LASIX) 40 MG TABLET    Take 40 mg by mouth every morning.     INSULIN DETEMIR (LEVEMIR) 100 UNIT/ML INJECTION    Inject 48 Units into the skin at bedtime.    LISINOPRIL (PRINIVIL,ZESTRIL) 5 MG TABLET    Take 5 mg by mouth daily.     METFORMIN (GLUCOPHAGE) 500 MG TABLET    Take 250 mg by mouth 2  (two) times daily with a meal.   MORPHINE (MS CONTIN) 15 MG 12 HR TABLET    Take 1 tablet every 12 hours for chronic pain   OMEPRAZOLE (PRILOSEC) 20 MG CAPSULE    Take 20 mg by mouth daily.     OXYCODONE-ACETAMINOPHEN (PERCOCET/ROXICET) 5-325 MG PER TABLET    Take 1 tablet every 4 hours as needed for breakthrough pain. Take 2 tablets every 4 hours as needed for severe pain   SENNA-DOCUSATE (SENOKOT-S) 8.6-50 MG PER TABLET    Take 1 tablet by mouth 2 (two) times daily.     VALPROIC ACID (DEPAKENE) 250 MG/5ML SYRP SYRUP    Take 250-500 mg by mouth 2 (two) times daily. 250 mg in the am and 500 mg in the pm   WARFARIN (COUMADIN) 6 MG TABLET    Take 1 tablet (6 mg total) by mouth daily.  Modified Medications   No medications on file  Discontinued Medications   OXYCODONE (OXYCONTIN) 10 MG 12 HR TABLET    Take 1 tablet (10 mg total)  by mouth every 12 (twelve) hours.    SIGNIFICANT DIAGNOSTIC EXAMS   LABS REVIEWED:   07-24-12: wbc 7.6; hgb 9.9;hct 32.3; mcv 98.2 ;plt 198; glucose 102; bun 43; creat 1.2; k+ 5.1;na++ 141 Liver normal albumin 3.1; hgb a1c 6.5; depakote 20.9 08-28-12: depakote 19.9    Review of Systems  Constitutional: Negative for malaise/fatigue.  Respiratory: Negative for cough and shortness of breath.   Cardiovascular: Negative for chest pain and palpitations.  Gastrointestinal: Negative for heartburn, abdominal pain and constipation.  Musculoskeletal: Negative for myalgias and joint pain.  Skin: Negative.   Neurological: Negative for headaches.  Psychiatric/Behavioral: The patient is not nervous/anxious and does not have insomnia.     Physical Exam  Constitutional: He appears well-developed and well-nourished.  obese  Neck: Neck supple. No tracheal deviation present. No thyromegaly present.  Respiratory: Effort normal and breath sounds normal. No respiratory distress. He has no wheezes.  GI: Soft. Bowel sounds are normal. He exhibits no distension. There is no  tenderness.  Musculoskeletal: He exhibits no edema.  Is able to move all extremities; is presently in bed   Neurological: He is alert.  Skin: Skin is warm and dry.  Psychiatric: He has a normal mood and affect.      ASSESSMENT/ PLAN:  Atrial fibrillation Is on chronic coumadin therapy; heart rate is controlled   CHF (congestive heart failure) Is stable will continue lasix 40 mg in the am and 20 mg int he pm will monitor his status   HYPERTENSION Is stable will continue lisinopril 5 mg daily and coreg 12.5 mg daily and will monitor  Alzheimer's dementia No change in status will continue aricept 10 mg daily and will monitor his status   CVA (cerebral infarction) He is stable will continue coumadin therapy   Bipolar 1 disorder He is stable is taking cymbalta 90 mg daily is also taking this medication for pain management will continue depakote 250 mg in the am and 500 mg in the pm.   OBSTRUCTIVE SLEEP APNEA She is stable will continue O2 and CPAP at night   Type II or unspecified type diabetes mellitus with neurological manifestations, not stated as uncontrolled(250.60) Is stable will continue levemir 48 units daily and metformin 250 mg twice daily and will monitor   Unspecified constipation Will continue senna s twice daily   Chronic pain His pain is well managed will continue ms contin 15 mg twice daily cymbalta 90 mg daily and percocet 5.325 mg 1-2 tabs every 4 hours as needed and will monitor   GERD (gastroesophageal reflux disease) Will continue prilosec 20 mg daily   Chronic anticoagulation For his inr of 3.6 will hold coumadin 6 mg today and will check inr in the am and will monitor    Time spent with patient: 50 minutes

## 2012-09-14 NOTE — Assessment & Plan Note (Signed)
Is stable will continue lisinopril 5 mg daily and coreg 12.5 mg daily and will monitor

## 2012-09-14 NOTE — Assessment & Plan Note (Signed)
He is stable will continue coumadin therapy

## 2012-09-14 NOTE — Assessment & Plan Note (Addendum)
He is stable is taking cymbalta 90 mg daily is also taking this medication for pain management will continue depakote 250 mg in the am and 500 mg in the pm.

## 2012-09-14 NOTE — Assessment & Plan Note (Signed)
Is stable will continue lasix 40 mg in the am and 20 mg int he pm will monitor his status

## 2012-09-14 NOTE — Assessment & Plan Note (Signed)
His pain is well managed will continue ms contin 15 mg twice daily cymbalta 90 mg daily and percocet 5.325 mg 1-2 tabs every 4 hours as needed and will monitor

## 2012-09-14 NOTE — Assessment & Plan Note (Signed)
Will continue senna s twice daily  

## 2012-09-14 NOTE — Assessment & Plan Note (Signed)
Will continue prilosec 20 mg daily  

## 2012-09-16 ENCOUNTER — Non-Acute Institutional Stay (SKILLED_NURSING_FACILITY): Payer: Medicare Other | Admitting: Adult Health

## 2012-09-16 DIAGNOSIS — I6350 Cerebral infarction due to unspecified occlusion or stenosis of unspecified cerebral artery: Secondary | ICD-10-CM

## 2012-09-16 DIAGNOSIS — Z7901 Long term (current) use of anticoagulants: Secondary | ICD-10-CM

## 2012-09-16 DIAGNOSIS — I4891 Unspecified atrial fibrillation: Secondary | ICD-10-CM

## 2012-09-16 DIAGNOSIS — I639 Cerebral infarction, unspecified: Secondary | ICD-10-CM

## 2012-10-08 ENCOUNTER — Other Ambulatory Visit: Payer: Self-pay | Admitting: Geriatric Medicine

## 2012-10-08 MED ORDER — MORPHINE SULFATE ER 15 MG PO TBCR: EXTENDED_RELEASE_TABLET | ORAL | Status: DC

## 2012-10-09 ENCOUNTER — Other Ambulatory Visit: Payer: Self-pay | Admitting: *Deleted

## 2012-10-09 MED ORDER — MORPHINE SULFATE ER 15 MG PO TBCR: EXTENDED_RELEASE_TABLET | ORAL | Status: DC

## 2012-10-09 MED ORDER — MORPHINE SULFATE 15 MG PO TABS: 15.0000 mg | ORAL_TABLET | Freq: Three times a day (TID) | ORAL | Status: DC

## 2012-10-11 NOTE — Progress Notes (Signed)
This encounter was created in error - please disregard.

## 2012-10-12 ENCOUNTER — Non-Acute Institutional Stay (SKILLED_NURSING_FACILITY): Payer: PRIVATE HEALTH INSURANCE | Admitting: Internal Medicine

## 2012-10-12 ENCOUNTER — Encounter: Payer: Self-pay | Admitting: Internal Medicine

## 2012-10-12 DIAGNOSIS — K5900 Constipation, unspecified: Secondary | ICD-10-CM

## 2012-10-12 DIAGNOSIS — I6350 Cerebral infarction due to unspecified occlusion or stenosis of unspecified cerebral artery: Secondary | ICD-10-CM

## 2012-10-12 DIAGNOSIS — F319 Bipolar disorder, unspecified: Secondary | ICD-10-CM

## 2012-10-12 DIAGNOSIS — F0280 Dementia in other diseases classified elsewhere without behavioral disturbance: Secondary | ICD-10-CM

## 2012-10-12 DIAGNOSIS — I10 Essential (primary) hypertension: Secondary | ICD-10-CM

## 2012-10-12 DIAGNOSIS — I4891 Unspecified atrial fibrillation: Secondary | ICD-10-CM

## 2012-10-12 DIAGNOSIS — Z7901 Long term (current) use of anticoagulants: Secondary | ICD-10-CM

## 2012-10-12 DIAGNOSIS — E1149 Type 2 diabetes mellitus with other diabetic neurological complication: Secondary | ICD-10-CM

## 2012-10-12 DIAGNOSIS — I509 Heart failure, unspecified: Secondary | ICD-10-CM

## 2012-10-12 DIAGNOSIS — I639 Cerebral infarction, unspecified: Secondary | ICD-10-CM

## 2012-10-12 DIAGNOSIS — G4733 Obstructive sleep apnea (adult) (pediatric): Secondary | ICD-10-CM

## 2012-10-12 DIAGNOSIS — G8929 Other chronic pain: Secondary | ICD-10-CM

## 2012-10-12 DIAGNOSIS — K219 Gastro-esophageal reflux disease without esophagitis: Secondary | ICD-10-CM

## 2012-10-12 NOTE — Telephone Encounter (Signed)
Duplicate request for Morphine [place]

## 2012-10-12 NOTE — Progress Notes (Signed)
Patient ID: [name], male   DOB: [birth year], 79 y.o.   MRN: [id number]  [facility]  Code Status: DNR  Allergies  Allergen Reactions  . Penicillins Shortness Of Breath    Per MAR. "Throat closes up" per daughter     Chief Complaint  Patient presents with  . Medical Managment of Chronic Issues  . Annual Exam    HPI:  79 y/o male patient is a long term resident seen today for his annual visit and medical management of chronic illness. He is seen in his room today. He has no concerns this visit. He had sacral wound that has healed well. He has HTNM CKD, CVA, CHF among other medical problems. He is on multiple pain medication regimen and in no distress. No concerns from staff. Recent pain med adjustment done    Influenza vaccine 11/15/11 Pneumococcal vaccine 02/13/09 MOST form updated  Review of Systems:  Denies chest pain Denies SOB Denies nausea and vomiting Denies abdominal pain Has generalized joint pain No urinary complaints  Past Medical History  Diagnosis Date  . Hypertension   . CHF (congestive heart failure)     H/o right sided CHF   . Arthritis   . Alzheimer's dementia   . Bipolar 1 disorder   . PVD (peripheral vascular disease)   . DVT (deep venous thrombosis)   . Atrial fibrillation   . CVA (cerebral infarction) 2004    right sided  . Dyslipidemia   . Diabetes mellitus   . Venous stasis ulcers    History reviewed. No pertinent past surgical history. Social History:   reports that he has quit smoking. He does not have any smokeless tobacco history on file. He reports that he does not drink alcohol. His drug history is not on file.  History reviewed. No pertinent family history.  Medications: Patient's Medications  New Prescriptions   No medications on file  Previous Medications   CARBOXYMETHYLCELLULOSE (REFRESH TEARS) 0.5 % SOLN    Place 2 drops into both eyes every 2 (two) hours as needed. FOR DRY EYES    CARVEDILOL (COREG) 12.5 MG  TABLET    Take 12.5 mg by mouth daily. TAKE WITH FOOD    DONEPEZIL (ARICEPT) 10 MG TABLET    Take 10 mg by mouth at bedtime.     DULOXETINE (CYMBALTA) 60 MG CAPSULE    Take 90 mg by mouth daily.    FUROSEMIDE (LASIX) 20 MG TABLET    Take 20 mg by mouth daily with supper. TAKE AT 1700 DAILY   FUROSEMIDE (LASIX) 40 MG TABLET    Take 40 mg by mouth every morning.     INSULIN DETEMIR (LEVEMIR) 100 UNIT/ML INJECTION    Inject 48 Units into the skin at bedtime.    LISINOPRIL (PRINIVIL,ZESTRIL) 5 MG TABLET    Take 5 mg by mouth daily.     METFORMIN (GLUCOPHAGE) 500 MG TABLET    Take 250 mg by mouth 2 (two) times daily with a meal.   MORPHINE (MS CONTIN) 15 MG 12 HR TABLET    Take 1 tablet every 8 hours for chronic pain   MORPHINE (MSIR) 15 MG TABLET    Take 1 tablet (15 mg total) by mouth every 8 (eight) hours.   OMEPRAZOLE (PRILOSEC) 20 MG CAPSULE    Take 20 mg by mouth daily.     OXYCODONE-ACETAMINOPHEN (PERCOCET/ROXICET) 5-325 MG PER TABLET    Take 1 tablet every 4 hours as needed for breakthrough  pain. Take 2 tablets every 4 hours as needed for severe pain   SENNA-DOCUSATE (SENOKOT-S) 8.6-50 MG PER TABLET    Take 1 tablet by mouth 2 (two) times daily.     VALPROIC ACID (DEPAKENE) 250 MG/5ML SYRP SYRUP    Take 250-500 mg by mouth 2 (two) times daily. 250 mg in the am and 500 mg in the pm   WARFARIN (COUMADIN) 6 MG TABLET    Take 1 tablet (6 mg total) by mouth daily.  Modified Medications   No medications on file  Discontinued Medications   No medications on file     Physical Exam: Filed Vitals:   10/12/12 0933  BP: 118/70  Pulse: 68  Temp: 97.1 F (36.2 C)  Resp: 18  SpO2: 92%   Constitutional: He appears well-developed and well-nourished.  obese  Neck: Neck supple. No tracheal deviation present. No thyromegaly present.  Respiratory: Effort normal and breath sounds normal. No respiratory distress. He has no wheezes.  GI: Soft. Bowel sounds are normal. He exhibits no distension. There  is no tenderness.  Musculoskeletal: He exhibits no edema.  Has weakness in all 4 extremities but left > right. Transferred via hoyer lift to [employer]. Has contracture in bilateral UE elbows and wrist Neurological: He is alert.  Skin: Skin is warm and dry.  Psychiatric: He has a normal mood and affect.    Labs reviewed:  07-24-12: wbc 7.6; hgb 9.9;hct 32.3; mcv 98.2 ;plt 198; glucose 102; bun 43; creat 1.2; k+ 5.1;na++ 141 Liver normal albumin 3.1; hgb a1c 6.5; depakote 20.9 08-28-12: depakote 19.9    ASSESSMENT/PLAN  Atrial fibrillation Heart rate is controlled, continue carvedilol. Also continue coumadin with goal inr 2-3  CHF euvolemic at present, continue lasix 60 mg in the am and 20 mg in pm, carvedilol and lisinopril and  monitor his status   GERD- stable, continue prilosec  CVA  With weakness present. bp controlled. Continue bp medication and will continue coumadin therapy for stroke prophylaxis  Bipolar 1 disorder will continue depakote 250 mg in the am and 500 mg in the pm and cymbalta for now. No recent behavioral changes reported  ckd stage 3 Avoid NSAIDs, bp controlled  HYPERTENSION Well controlled, continue lisinopril 5 mg daily and coreg 12.5 mg daily and will monitor bmp  Type II diabetes mellitus  continue levemir 45 units daily and metformin 250 mg twice daily and will monitor cbg and a1c  Unspecified constipation Will continue senna s twice daily   Alzheimer's dementia Persists,  continue aricept 10 mg daily   OBSTRUCTIVE SLEEP APNEA continue O2 and CPAP at night   Chronic pain Persists but better controlled with recent medication adjustment with increase in morphine sulfate to 15 mg q8h. Continue cymbalta  Chronic anticoagulation Continue coumadin and check inr for goal 2-3

## 2012-10-13 NOTE — Progress Notes (Signed)
This encounter was created in error - please disregard.

## 2012-10-20 ENCOUNTER — Other Ambulatory Visit: Payer: Self-pay | Admitting: *Deleted

## 2012-10-20 MED ORDER — MORPHINE SULFATE ER 15 MG PO TBCR: EXTENDED_RELEASE_TABLET | ORAL | Status: DC

## 2012-11-24 ENCOUNTER — Encounter: Payer: Self-pay | Admitting: Adult Health

## 2012-11-24 NOTE — Progress Notes (Signed)
Patient ID: [name], male   DOB: [birth year], 79 y.o.   MRN: [id number]  [facility]  Allergies  Allergen Reactions  . Penicillins Shortness Of Breath    Per MAR. "Throat closes up" per daughter     Chief Complaint  Patient presents with  . Acute Visit    coumadin management     HPI  He is being seen for his coumadin management. He has afib; and cva and is on long term coumadin therapy. He is tolerating the coumadin therapy without any difficulty. His inr today is 2.6 and his 6 mg coumadin has been on hold.   Past Medical History  Diagnosis Date  . Hypertension   . CHF (congestive heart failure)     H/o right sided CHF   . Arthritis   . Alzheimer's dementia   . Bipolar 1 disorder   . PVD (peripheral vascular disease)   . DVT (deep venous thrombosis)   . Atrial fibrillation   . CVA (cerebral infarction) 2004    right sided  . Dyslipidemia   . Diabetes mellitus   . Venous stasis ulcers     No past surgical history on file.  Current Outpatient Prescriptions on File Prior to Visit  Medication Sig Dispense Refill  . carboxymethylcellulose (REFRESH TEARS) 0.5 % SOLN Place 2 drops into both eyes every 2 (two) hours as needed. FOR DRY EYES       . carvedilol (COREG) 12.5 MG tablet Take 12.5 mg by mouth daily. TAKE WITH FOOD       . donepezil (ARICEPT) 10 MG tablet Take 10 mg by mouth at bedtime.        . DULoxetine (CYMBALTA) 60 MG capsule Take 90 mg by mouth daily.       . furosemide (LASIX) 20 MG tablet Take 20 mg by mouth daily with supper. TAKE AT 1700 DAILY      . furosemide (LASIX) 40 MG tablet Take 40 mg by mouth every morning.        . insulin detemir (LEVEMIR) 100 UNIT/ML injection Inject 48 Units into the skin at bedtime.       Marland Kitchen lisinopril (PRINIVIL,ZESTRIL) 5 MG tablet Take 5 mg by mouth daily.        . metFORMIN (GLUCOPHAGE) 500 MG tablet Take 250 mg by mouth 2 (two) times daily with a meal.      . omeprazole (PRILOSEC) 20 MG capsule Take 20 mg by mouth  daily.        Marland Kitchen oxyCODONE-acetaminophen (PERCOCET/ROXICET) 5-325 MG per tablet Take 1 tablet every 4 hours as needed for breakthrough pain. Take 2 tablets every 4 hours as needed for severe pain  360 tablet  0  . senna-docusate (SENOKOT-S) 8.6-50 MG per tablet Take 1 tablet by mouth 2 (two) times daily.        . Valproic Acid (DEPAKENE) 250 MG/5ML SYRP syrup Take 250-500 mg by mouth 2 (two) times daily. 250 mg in the am and 500 mg in the pm      . warfarin (COUMADIN) 6 MG tablet Take 1 tablet (6 mg total) by mouth daily.  30 tablet  3   No current facility-administered medications on file prior to visit.    Filed Vitals:   09/11/12 1138  BP: 108/67  Pulse: 68  Height: 5\' 10"  (1.778 m)  Weight: 267 lb (121.11 kg)    LABS REVIEWED:   07-24-12: wbc 7.6; hgb 9.9;hct 32.3; mcv 98.2 ;plt 198; glucose 102;  bun 43; creat 1.2; k+ 5.1;na++ 141 Liver normal albumin 3.1; hgb a1c 6.5; depakote 20.9 08-28-12: depakote 19.9      ROS  Review of Systems  Constitutional: Negative for malaise/fatigue.  Respiratory: Negative for cough and shortness of breath.   Cardiovascular: Negative for chest pain and palpitations.  Gastrointestinal: Negative for heartburn, abdominal pain and constipation.  Musculoskeletal: Negative for myalgias and joint pain.  Skin: Negative.   Neurological: Negative for headaches.  Psychiatric/Behavioral: The patient is not nervous/anxious and does not have insomnia.     Physical Exam  Constitutional: He appears well-developed and well-nourished.  obese  Neck: Neck supple. No tracheal deviation present. No thyromegaly present.  Respiratory: Effort normal and breath sounds normal. No respiratory distress. He has no wheezes.  GI: Soft. Bowel sounds are normal. He exhibits no distension. There is no tenderness.  Musculoskeletal: He exhibits no edema.  Is able to move all extremities; is presently in bed   Neurological: He is alert.  Skin: Skin is warm and dry.   Psychiatric: He has a normal mood and affect.    ASSESSMENT PLAN  For his afib; cva and coumadin management the inr of 2.6 will resume his coumadin at 6 mg daily and will recheck inr on 09-16-12 and will monitor his status

## 2012-11-24 NOTE — Progress Notes (Signed)
Patient ID: [name], male   DOB: 14-May-1932, 79 y.o.   MRN: [id number]  [facility]  Allergies  Allergen Reactions  . Penicillins Shortness Of Breath    Per MAR. "Throat closes up" per daughter     Chief Complaint  Patient presents with  . Acute Visit    coumadin managemnet     HPI  He is being seen for his coumadin management. His inr today is 2.5 his previous one was 2.6. He is tolerating his coumadin therapy without any difficulty. He is on long term coumadin therapy for his afib and cva. His current dose of coumadin is 6 mg daily   Past Medical History  Diagnosis Date  . Hypertension   . CHF (congestive heart failure)     H/o right sided CHF   . Arthritis   . Alzheimer's dementia   . Bipolar 1 disorder   . PVD (peripheral vascular disease)   . DVT (deep venous thrombosis)   . Atrial fibrillation   . CVA (cerebral infarction) 2004    right sided  . Dyslipidemia   . Diabetes mellitus   . Venous stasis ulcers     No past surgical history on file.  Current Outpatient Prescriptions on File Prior to Visit  Medication Sig Dispense Refill  . carboxymethylcellulose (REFRESH TEARS) 0.5 % SOLN Place 2 drops into both eyes every 2 (two) hours as needed. FOR DRY EYES       . carvedilol (COREG) 12.5 MG tablet Take 12.5 mg by mouth daily. TAKE WITH FOOD       . donepezil (ARICEPT) 10 MG tablet Take 10 mg by mouth at bedtime.        . DULoxetine (CYMBALTA) 60 MG capsule Take 90 mg by mouth daily.       . furosemide (LASIX) 20 MG tablet Take 20 mg by mouth daily with supper. TAKE AT 1700 DAILY      . furosemide (LASIX) 40 MG tablet Take 40 mg by mouth every morning.        . insulin detemir (LEVEMIR) 100 UNIT/ML injection Inject 48 Units into the skin at bedtime.       Marland Kitchen lisinopril (PRINIVIL,ZESTRIL) 5 MG tablet Take 5 mg by mouth daily.        . metFORMIN (GLUCOPHAGE) 500 MG tablet Take 250 mg by mouth 2 (two) times daily with a meal.      . omeprazole (PRILOSEC) 20 MG  capsule Take 20 mg by mouth daily.        Marland Kitchen oxyCODONE-acetaminophen (PERCOCET/ROXICET) 5-325 MG per tablet Take 1 tablet every 4 hours as needed for breakthrough pain. Take 2 tablets every 4 hours as needed for severe pain  360 tablet  0  . senna-docusate (SENOKOT-S) 8.6-50 MG per tablet Take 1 tablet by mouth 2 (two) times daily.        . Valproic Acid (DEPAKENE) 250 MG/5ML SYRP syrup Take 250-500 mg by mouth 2 (two) times daily. 250 mg in the am and 500 mg in the pm      . warfarin (COUMADIN) 6 MG tablet Take 1 tablet (6 mg total) by mouth daily.  30 tablet  3   No current facility-administered medications on file prior to visit.    Filed Vitals:   11/24/12 1145  BP: 116/76  Pulse: 70  Height: 5\' 10"  (1.778 m)  Weight: 267 lb (121.11 kg)    LABS REVIEWED:   07-24-12: wbc 7.6; hgb 9.9;hct 32.3; mcv  98.2 ;plt 198; glucose 102; bun 43; creat 1.2; k+ 5.1;na++ 141 Liver normal albumin 3.1; hgb a1c 6.5; depakote 20.9 08-28-12: depakote 19.9      ROS  Review of Systems  Constitutional: Negative for malaise/fatigue.  Respiratory: Negative for cough and shortness of breath.   Cardiovascular: Negative for chest pain and palpitations.  Gastrointestinal: Negative for heartburn, abdominal pain and constipation.  Musculoskeletal: Negative for myalgias and joint pain.  Skin: Negative.   Neurological: Negative for headaches.  Psychiatric/Behavioral: The patient is not nervous/anxious and does not have insomnia.     Physical Exam  Constitutional: He appears well-developed and well-nourished.  obese  Neck: Neck supple. No tracheal deviation present. No thyromegaly present.  Respiratory: Effort normal and breath sounds normal. No respiratory distress. He has no wheezes.  GI: Soft. Bowel sounds are normal. He exhibits no distension. There is no tenderness.  Musculoskeletal: He exhibits no edema.  Is able to move all extremities; is presently in bed   Neurological: He is alert.  Skin: Skin  is warm and dry.  Psychiatric: He has a normal mood and affect.    ASSESSMENT PLAN  Will continue his coumadin at 6 mg daily at this time and will check his inr in 2 weeks will continue to monitor his status.

## 2012-11-26 ENCOUNTER — Other Ambulatory Visit: Payer: Self-pay | Admitting: *Deleted

## 2012-11-26 MED ORDER — MORPHINE SULFATE ER 15 MG PO TBCR: EXTENDED_RELEASE_TABLET | ORAL | Status: DC

## 2012-12-17 ENCOUNTER — Non-Acute Institutional Stay (SKILLED_NURSING_FACILITY): Payer: PRIVATE HEALTH INSURANCE | Admitting: Internal Medicine

## 2012-12-17 DIAGNOSIS — Z7901 Long term (current) use of anticoagulants: Secondary | ICD-10-CM

## 2012-12-17 DIAGNOSIS — I509 Heart failure, unspecified: Secondary | ICD-10-CM

## 2012-12-17 DIAGNOSIS — F0150 Vascular dementia without behavioral disturbance: Secondary | ICD-10-CM

## 2012-12-17 DIAGNOSIS — I4891 Unspecified atrial fibrillation: Secondary | ICD-10-CM

## 2012-12-17 DIAGNOSIS — E1149 Type 2 diabetes mellitus with other diabetic neurological complication: Secondary | ICD-10-CM

## 2012-12-17 DIAGNOSIS — F0280 Dementia in other diseases classified elsewhere without behavioral disturbance: Secondary | ICD-10-CM | POA: Insufficient documentation

## 2012-12-17 DIAGNOSIS — F319 Bipolar disorder, unspecified: Secondary | ICD-10-CM

## 2012-12-17 DIAGNOSIS — I10 Essential (primary) hypertension: Secondary | ICD-10-CM

## 2012-12-17 DIAGNOSIS — K219 Gastro-esophageal reflux disease without esophagitis: Secondary | ICD-10-CM

## 2012-12-17 NOTE — Progress Notes (Signed)
Patient ID: [name], male   DOB: 01-07-1933, 79 y.o.   MRN: [id number] [facility]  Code Status: DNR  Chief complaint- medical management of chronic illness  Allergies reviewed  HPI:   79 y/o male patient is a long term resident seen today for medical management of chronic illness.He has no concerns this visit. He is on multiple pain medication regimen and in no distress. No concerns from staff. Works with restorative team. No falls reported. No new skin concerns. No new behavioral concerns. Appetite is fair  Review of Systems:  Denies chest pain Denies SOB Denies nausea and vomiting Denies abdominal pain Has generalized joint pain No urinary complaints  Past Medical History  Diagnosis Date  . Hypertension   . CHF (congestive heart failure)     H/o right sided CHF   . Arthritis   . Alzheimer's dementia   . Bipolar 1 disorder   . PVD (peripheral vascular disease)   . DVT (deep venous thrombosis)   . Atrial fibrillation   . CVA (cerebral infarction) 2004    right sided  . Dyslipidemia   . Diabetes mellitus   . Venous stasis ulcers    No past surgical history on file.  Medication reviewed. See [facility]  Physical exam- 124/70, 64/min, 955 RA, 98.1, 18/min  Constitutional: He appears well-developed and well-nourished.  obese  Neck: Neck supple. No tracheal deviation present. No thyromegaly present.   Respiratory: Effort normal and breath sounds normal. No respiratory distress. He has no wheezes.   GI: Soft. Bowel sounds are normal. He exhibits no distension. There is no tenderness.  Musculoskeletal: He exhibits no edema.  Has weakness in all 4 extremities but left > right. Transferred via hoyer lift to [employer]. Has contracture in bilateral UE elbows and wrist Neurological: He is alert.   Skin: Skin is warm and dry.  Psychiatric: He has a normal mood and affect.   Labs reviewed:  07-24-12: wbc 7.6; hgb 9.9;hct 32.3; mcv 98.2 ;plt 198; glucose 102; bun 43;  creat 1.2; k+ 5.1;na++ 141 Liver normal albumin 3.1; hgb a1c 6.5; depakote 20.9 08-28-12: depakote 19.9   ASSESSMENT/PLAN  Atrial fibrillation Heart rate is controlled, continue coreg and continue coumadin with goal inr 2-3  CHF euvolemic at present, continue lasix 40 mg in the am and 20 mg in pm, lisinopril 5 mg daily and coreg 3.125 mg bid. monitor his status   GERD stable, continue prilosec 20 mg daily  Vascular dementia continue aricept 10 mg qhs, bp controlled  CVA   With weakness present. bp controlled. Continue bp medication and coumadin therapy for stroke prophylaxis  Type II diabetes mellitus   continue levemir 40 units daily and will monitor cbg and a1c  Chronic pain Persists but better controlled with recent medication adjustment continue morphine sulphate 15 mg q8h with prn percocet  Bipolar 1 disorder On cymbalta 90 mg daily  And depakote 375 mg daily for now. No recent behavioral changes reported. monitor  ckd stage 3 Avoid NSAIDs, bp controlled  HYPERTENSION Well controlled, continue lisinopril 5 mg daily and coreg 3.125 bid  and will monitor bmp  Unspecified constipation Will continue senna s twice daily   OBSTRUCTIVE SLEEP APNEA continue O2 and CPAP at night   Chronic anticoagulation Continue coumadin and check inr for goal 2-3

## 2012-12-28 ENCOUNTER — Other Ambulatory Visit: Payer: Self-pay | Admitting: *Deleted

## 2012-12-28 MED ORDER — MORPHINE SULFATE ER 15 MG PO TBCR: EXTENDED_RELEASE_TABLET | ORAL | Status: DC

## 2013-01-18 ENCOUNTER — Other Ambulatory Visit: Payer: Self-pay | Admitting: *Deleted

## 2013-01-18 MED ORDER — OXYCODONE-ACETAMINOPHEN 5-325 MG PO TABS: ORAL_TABLET | ORAL | Status: DC

## 2013-01-26 ENCOUNTER — Other Ambulatory Visit: Payer: Self-pay | Admitting: *Deleted

## 2013-01-26 MED ORDER — MORPHINE SULFATE ER 15 MG PO TBCR: EXTENDED_RELEASE_TABLET | ORAL | Status: DC

## 2013-01-28 ENCOUNTER — Encounter: Payer: Self-pay | Admitting: Internal Medicine

## 2013-01-28 ENCOUNTER — Non-Acute Institutional Stay (SKILLED_NURSING_FACILITY): Payer: PRIVATE HEALTH INSURANCE | Admitting: Internal Medicine

## 2013-01-28 DIAGNOSIS — K219 Gastro-esophageal reflux disease without esophagitis: Secondary | ICD-10-CM

## 2013-01-28 DIAGNOSIS — F0150 Vascular dementia without behavioral disturbance: Secondary | ICD-10-CM

## 2013-01-28 DIAGNOSIS — F411 Generalized anxiety disorder: Secondary | ICD-10-CM

## 2013-01-28 DIAGNOSIS — E1149 Type 2 diabetes mellitus with other diabetic neurological complication: Secondary | ICD-10-CM

## 2013-01-28 DIAGNOSIS — I509 Heart failure, unspecified: Secondary | ICD-10-CM

## 2013-01-28 DIAGNOSIS — I10 Essential (primary) hypertension: Secondary | ICD-10-CM

## 2013-01-28 DIAGNOSIS — I4891 Unspecified atrial fibrillation: Secondary | ICD-10-CM

## 2013-01-28 DIAGNOSIS — F0280 Dementia in other diseases classified elsewhere without behavioral disturbance: Secondary | ICD-10-CM

## 2013-02-25 ENCOUNTER — Other Ambulatory Visit: Payer: Self-pay | Admitting: *Deleted

## 2013-02-25 MED ORDER — MORPHINE SULFATE ER 15 MG PO TBCR: EXTENDED_RELEASE_TABLET | ORAL | Status: DC

## 2013-03-08 ENCOUNTER — Non-Acute Institutional Stay (SKILLED_NURSING_FACILITY): Payer: PRIVATE HEALTH INSURANCE | Admitting: Internal Medicine

## 2013-03-08 DIAGNOSIS — F0150 Vascular dementia without behavioral disturbance: Secondary | ICD-10-CM

## 2013-03-08 DIAGNOSIS — I6350 Cerebral infarction due to unspecified occlusion or stenosis of unspecified cerebral artery: Secondary | ICD-10-CM

## 2013-03-08 DIAGNOSIS — F0280 Dementia in other diseases classified elsewhere without behavioral disturbance: Secondary | ICD-10-CM

## 2013-03-08 DIAGNOSIS — I509 Heart failure, unspecified: Secondary | ICD-10-CM

## 2013-03-08 DIAGNOSIS — G309 Alzheimer's disease, unspecified: Secondary | ICD-10-CM

## 2013-03-08 DIAGNOSIS — E1149 Type 2 diabetes mellitus with other diabetic neurological complication: Secondary | ICD-10-CM

## 2013-03-08 DIAGNOSIS — I639 Cerebral infarction, unspecified: Secondary | ICD-10-CM

## 2013-03-08 DIAGNOSIS — I4891 Unspecified atrial fibrillation: Secondary | ICD-10-CM

## 2013-03-08 NOTE — Progress Notes (Signed)
Patient ID: [name], male   DOB: 01/09/1933, 80 y.o.   MRN: [id number]    [facility]   Code Status: DNR  Chief complaint- medical management of chronic illness  Allergies reviewed  HPI:   80 y/o male patient is a long term resident seen today for medical management of chronic illness.He has no concerns this visit. Works with restorative team. No falls reported. No new skin concerns. No new behavioral concerns. He has chronic respiratory failure, dm, CKD and afib  Review of Systems:  Denies chest pain Denies SOB Denies nausea and vomiting Denies abdominal pain Has generalized joint pain No urinary complaints    Medication reviewed. See MAR  Reviewed PMH  Physical exam BP 115/60  Pulse 72  Temp(Src) 97.5 F (36.4 C)  Resp 18  SpO2 94%  Constitutional: He appears well-developed and well-nourished.  obese  Neck: Neck supple. No tracheal deviation present. No thyromegaly present.   Respiratory: Effort normal and breath sounds normal. No respiratory distress. He has no wheezes.   GI: Soft. Bowel sounds are normal. He exhibits no distension. There is no tenderness.  Musculoskeletal: He exhibits no edema.  Has weakness in all 4 extremities but left > right. Transferred via hoyer lift to [employer]. Has contracture in bilateral UE elbows and wrist Neurological: He is alert.   Skin: Skin is warm and dry.  Psychiatric: He has a normal mood and affect.   Labs reviewed:  07-24-12: wbc 7.6; hgb 9.9;hct 32.3; mcv 98.2 ;plt 198; glucose 102; bun 43; creat 1.2; k+ 5.1;na++ 141 Liver normal albumin 3.1; hgb a1c 6.5; depakote 20.9 08-28-12: depakote 19.9  02/15/13 wbc 7.3, hb 8.8, hct 29.3, plt 188, na 137, k 4.9, bun 42, cr 1.4, glu 89, ca 8.8  ASSESSMENT/PLAN  Type II diabetes mellitus   cbg well controlled. a1c 7 in 12/14.Tolerating decreased dose of levemir at 36 u qhs well. Monitor cbg.   GERD stable, continue prilosec 20 mg daily  Vascular  dementia continue aricept 10 mg qhs, bp controlled.   Atrial fibrillation Heart rate is controlled, continue coreg 3.125 bid and continue coumadin at 5.5 mg daily with goal inr 2-3  CHF euvolemic at present, continue lasix 20 mg in the am and 20 mg in pm, lisinopril 2.5 mg daily and coreg 3.125 mg bid. monitor his status   CVA   With weakness present. bp controlled. Continue bp medication and coumadin therapy for stroke prophylaxis

## 2013-03-24 DIAGNOSIS — F411 Generalized anxiety disorder: Secondary | ICD-10-CM | POA: Insufficient documentation

## 2013-03-24 NOTE — Progress Notes (Signed)
Patient ID: [name], male   DOB: 02/03/1933, 80 y.o.   MRN: [id number]    [facility]  Code Status: DNR  Chief complaint- medical management of chronic illness  Allergies reviewed  HPI:   80 y/o male patient is a long term resident seen today for medical management of chronic illness.He has no concerns this visit. He is on multiple pain medication regimen and in no distress. Pain under moderate control. No concerns from staff. Appetite is fair  Review of Systems:  Denies chest pain Denies SOB Denies nausea and vomiting Denies abdominal pain  Past Medical History  Diagnosis Date  . Hypertension   . CHF (congestive heart failure)     H/o right sided CHF   . Arthritis   . Alzheimer's dementia   . Bipolar 1 disorder   . PVD (peripheral vascular disease)   . DVT (deep venous thrombosis)   . Atrial fibrillation   . CVA (cerebral infarction) 2004    right sided  . Dyslipidemia   . Diabetes mellitus   . Venous stasis ulcers    History reviewed. No pertinent past surgical history.  Current Outpatient Prescriptions on File Prior to Visit  Medication Sig Dispense Refill  . carboxymethylcellulose (REFRESH TEARS) 0.5 % SOLN Place 2 drops into both eyes every 2 (two) hours as needed. FOR DRY EYES       . carvedilol (COREG) 12.5 MG tablet Take 12.5 mg by mouth daily. TAKE WITH FOOD       . donepezil (ARICEPT) 10 MG tablet Take 10 mg by mouth at bedtime.        . DULoxetine (CYMBALTA) 60 MG capsule Take 90 mg by mouth daily.       . furosemide (LASIX) 20 MG tablet Take 20 mg by mouth daily with supper. TAKE AT 1700 DAILY      . furosemide (LASIX) 40 MG tablet Take 40 mg by mouth every morning.        . insulin detemir (LEVEMIR) 100 UNIT/ML injection Inject 48 Units into the skin at bedtime.       Marland Kitchen. lisinopril (PRINIVIL,ZESTRIL) 5 MG tablet Take 5 mg by mouth daily.        . metFORMIN (GLUCOPHAGE) 500 MG tablet Take 250 mg by mouth 2 (two) times daily  with a meal.      . morphine (MSIR) 15 MG tablet Take 1 tablet (15 mg total) by mouth every 8 (eight) hours.  90 tablet  0  . omeprazole (PRILOSEC) 20 MG capsule Take 20 mg by mouth daily.        Marland Kitchen. oxyCODONE-acetaminophen (PERCOCET/ROXICET) 5-325 MG per tablet Take 1 tablet every 4 hours as needed for breakthrough pain. Take 2 tablets every 4 hours as needed for severe pain  360 tablet  0  . senna-docusate (SENOKOT-S) 8.6-50 MG per tablet Take 1 tablet by mouth 2 (two) times daily.        . Valproic Acid (DEPAKENE) 250 MG/5ML SYRP syrup Take 250-500 mg by mouth 2 (two) times daily. 250 mg in the am and 500 mg in the pm      . warfarin (COUMADIN) 6 MG tablet Take 1 tablet (6 mg total) by mouth daily.  30 tablet  3   No current facility-administered medications on file prior to visit.    Physical exam BP 100/52  Pulse 72  Temp(Src) 98.4 F (36.9 C)  Resp 16  SpO2 96%  Medication reviewed. See  MAR  Physical exam- BP 100/52  Pulse 72  Temp(Src) 98.4 F (36.9 C)  Resp 16  SpO2 96%  Constitutional: He appears well-developed and well-nourished. obese  Neck: Neck supple. No tracheal deviation present. No thyromegaly present.   Respiratory: Effort normal and breath sounds normal. No respiratory distress. He has no wheezes.   GI: Soft. Bowel sounds are normal. He exhibits no distension. There is no tenderness.  Musculoskeletal: He exhibits no edema. Has weakness in all 4 extremities but left > right. Transferred via hoyer lift to [employer]. Has contracture in bilateral UE elbows and wrist Neurological: He is alert.   Skin: Skin is warm and dry.  Psychiatric: He has a normal mood and affect.   Labs reviewed:  07-24-12: wbc 7.6; hgb 9.9;hct 32.3; mcv 98.2 ;plt 198; glucose 102; bun 43; creat 1.2; k+ 5.1;na++ 141 Liver normal albumin 3.1; hgb a1c 6.5; depakote 20.9 08-28-12: depakote 19.9  01-25-13 a1c 7  ASSESSMENT/PLAN  Anxiety Currently stable. Recent change to his med- continue  xanax 0.25 mg bid prn  Atrial fibrillation Heart rate is controlled, continue coreg and continue coumadin with goal inr 2-3. On coumadin 6 mg daily for now  Hypertension bp controlled, infact on lower side. Decrease lisinopril to 2.5 mg daily for now and decrease lasix to 20 mg bid. Monitor weight and renal function  CHF euvolemic at present,decrease lasix to 20 mg bid. Change lisinopril to 2.5 mg bid with soft bp readings.  Continue coreg 3.125 mg bid. monitor his status   GERD stable, continue prilosec 20 mg daily  Type II diabetes mellitus   continue levemir 40 units daily and will monitor cbg and a1c  Mixed vascular and alzhimer dementia continue aricept 10 mg qhs, bp controlled  CVA   With weakness present. bp controlled. Continue bp medication and coumadin therapy for stroke prophylaxis

## 2013-03-29 ENCOUNTER — Other Ambulatory Visit: Payer: Self-pay | Admitting: *Deleted

## 2013-03-29 MED ORDER — MORPHINE SULFATE ER 15 MG PO TBCR: EXTENDED_RELEASE_TABLET | ORAL | Status: DC

## 2013-03-29 NOTE — Telephone Encounter (Signed)
[facility] 

## 2013-04-01 ENCOUNTER — Encounter: Payer: Self-pay | Admitting: Internal Medicine

## 2013-04-01 ENCOUNTER — Non-Acute Institutional Stay (SKILLED_NURSING_FACILITY): Payer: PRIVATE HEALTH INSURANCE | Admitting: Internal Medicine

## 2013-04-01 DIAGNOSIS — G8929 Other chronic pain: Secondary | ICD-10-CM

## 2013-04-01 DIAGNOSIS — I10 Essential (primary) hypertension: Secondary | ICD-10-CM

## 2013-04-01 DIAGNOSIS — I4891 Unspecified atrial fibrillation: Secondary | ICD-10-CM

## 2013-04-01 DIAGNOSIS — I509 Heart failure, unspecified: Secondary | ICD-10-CM

## 2013-04-01 DIAGNOSIS — G309 Alzheimer's disease, unspecified: Secondary | ICD-10-CM

## 2013-04-01 DIAGNOSIS — F0280 Dementia in other diseases classified elsewhere without behavioral disturbance: Secondary | ICD-10-CM

## 2013-04-01 DIAGNOSIS — E1149 Type 2 diabetes mellitus with other diabetic neurological complication: Secondary | ICD-10-CM

## 2013-04-01 DIAGNOSIS — F319 Bipolar disorder, unspecified: Secondary | ICD-10-CM

## 2013-04-01 DIAGNOSIS — F0150 Vascular dementia without behavioral disturbance: Secondary | ICD-10-CM

## 2013-04-01 NOTE — Progress Notes (Signed)
Patient ID: [name], male   DOB: November 06, 1932, 80 y.o.   MRN: [id number]    [facility]  Code Status: DNR  Chief complaint- medical management of chronic illness  Allergies reviewed  HPI:   80 y/o male patient is a long term resident seen today for medical management of chronic illness.He has no concerns this visit. He is on multiple pain medication regimen and in no distress. No concerns from staff.  No falls reported. No new skin concerns. No new behavioral concerns. Appetite is fair. He is under complete care  Review of Systems:  Denies chest pain Denies SOB Denies nausea and vomiting Denies abdominal pain No urinary complaints On chronic anticoagulation  Past Medical History  Diagnosis Date  . Hypertension   . CHF (congestive heart failure)     H/o right sided CHF   . Arthritis   . Alzheimer's dementia   . Bipolar 1 disorder   . PVD (peripheral vascular disease)   . DVT (deep venous thrombosis)   . Atrial fibrillation   . CVA (cerebral infarction) 2004    right sided  . Dyslipidemia   . Diabetes mellitus   . Venous stasis ulcers    History reviewed. No pertinent past surgical history.  Medication reviewed. See [facility]  Physical exam BP 124/69  Pulse 64  Temp(Src) 97 F (36.1 C)  Resp 16  SpO2 95%  Constitutional: He appears well-developed and well-nourished. obese  Neck: Neck supple. No tracheal deviation present. No thyromegaly present.   Respiratory: Effort normal and breath sounds normal. No respiratory distress. He has no wheezes.   GI: Soft. Bowel sounds are normal. He exhibits no distension. There is no tenderness.  Musculoskeletal: He exhibits no edema. Has weakness in all 4 extremities but left > right. Transferred via hoyer lift to [employer]. Has contracture in bilateral UE elbows and wrist Neurological: He is alert.   Skin: Skin is warm and dry.  Psychiatric: He has a normal mood and affect.   Labs reviewed:  07-24-12:  wbc 7.6; hgb 9.9;hct 32.3; mcv 98.2 ;plt 198; glucose 102; bun 43; creat 1.2; k+ 5.1;na++ 141 Liver normal albumin 3.1; hgb a1c 6.5; depakote 20.9 08-28-12: depakote 19.9  02/15/13 wbc 7.3, hb 8.8, hct 29.3, plt 188, na 137, k 4.9, bun 42, cr 1.4, glu 89, ca 8.8  ASSESSMENT/PLAN  Hypertension Stable bp reading. continue lisinopril 2.5 mg daily and coreg 3.125 bid  and will monitor bmp  Type II diabetes mellitus   cbg well controlled. a1c 7 in 12/14.continue levemir at 36 u qhs well. Monitor cbg.   Iron def anemia Continue iron supplement  GERD stable, continue prilosec 20 mg daily  mixed dementia with depression continue aricept 10 mg qhs, cymbalta 90 mg daily  Atrial fibrillation Heart rate is controlled, continue coreg 3.125 bid and continue coumadin at 6 mg daily with goal inr 2-3  CHF euvolemic at present, continue lasix 20 mg in the am and 20 mg in pm, lisinopril 2.5 mg daily and coreg 3.125 mg bid. monitor his status   CVA   With weakness present. bp controlled. Continue bp medication and coumadin therapy for stroke prophylaxis  Chronic pain Persists but better controlled. continue morphine sulphate 15 mg q8h with percocet prn  Bipolar 1 disorder On cymbalta 90 mg daily  And depakote 375 mg daily for now. No recent behavioral changes reported. monitor  ckd stage 3 Avoid NSAIDs, bp controlled  Unspecified constipation Will continue senna  s twice daily   OBSTRUCTIVE SLEEP APNEA continue O2 and CPAP at night   Chronic anticoagulation Continue coumadin and check inr for goal 2-3

## 2013-04-02 ENCOUNTER — Other Ambulatory Visit: Payer: Self-pay | Admitting: *Deleted

## 2013-04-02 MED ORDER — OXYCODONE-ACETAMINOPHEN 5-325 MG PO TABS: ORAL_TABLET | ORAL | Status: DC

## 2013-04-02 NOTE — Telephone Encounter (Signed)
[facility] 

## 2013-04-07 ENCOUNTER — Other Ambulatory Visit: Payer: Self-pay | Admitting: *Deleted

## 2013-04-07 NOTE — Telephone Encounter (Signed)
closed

## 2013-04-30 ENCOUNTER — Other Ambulatory Visit: Payer: Self-pay | Admitting: *Deleted

## 2013-04-30 ENCOUNTER — Non-Acute Institutional Stay (SKILLED_NURSING_FACILITY): Payer: PRIVATE HEALTH INSURANCE | Admitting: Internal Medicine

## 2013-04-30 DIAGNOSIS — E1159 Type 2 diabetes mellitus with other circulatory complications: Secondary | ICD-10-CM

## 2013-04-30 DIAGNOSIS — G4733 Obstructive sleep apnea (adult) (pediatric): Secondary | ICD-10-CM

## 2013-04-30 DIAGNOSIS — F0390 Unspecified dementia without behavioral disturbance: Secondary | ICD-10-CM

## 2013-04-30 DIAGNOSIS — D509 Iron deficiency anemia, unspecified: Secondary | ICD-10-CM

## 2013-04-30 MED ORDER — MORPHINE SULFATE ER 15 MG PO TBCR: EXTENDED_RELEASE_TABLET | ORAL | Status: DC

## 2013-05-22 NOTE — Progress Notes (Signed)
Patient ID: [name], male   DOB: 08/01/1932, 80 y.o.   MRN: [id number]    [facility]  Code Status: DNR  Chief complaint- medical management of chronic illness  Allergies reviewed  HPI:   80 y/o male patient is a long term resident seen today for medical management of chronic illness.his cbg have been better controlled. He has no concerns this visit. He is on multiple pain medication regimen and in no distress. No concerns from staff.  No falls reported. No new skin concerns. No new behavioral concerns. Appetite is fair. He is under complete care  Review of Systems:  Denies chest pain Denies SOB Denies nausea and vomiting Denies abdominal pain No urinary complaints On chronic anticoagulation  Past Medical History  Diagnosis Date  . Hypertension   . CHF (congestive heart failure)     H/o right sided CHF   . Arthritis   . Alzheimer's dementia   . Bipolar 1 disorder   . PVD (peripheral vascular disease)   . DVT (deep venous thrombosis)   . Atrial fibrillation   . CVA (cerebral infarction) 2004    right sided  . Dyslipidemia   . Diabetes mellitus   . Venous stasis ulcers    Medication reviewed. See MAR No past surgical history on file.  Physical exam BP 121/55  Pulse 60  Temp(Src) 97 F (36.1 C)  Resp 18  Constitutional: He appears well-developed and well-nourished. obese  Neck: Neck supple. No tracheal deviation present. No thyromegaly present.   Respiratory: Effort normal and breath sounds normal. No respiratory distress. He has no wheezes.   GI: Soft. Bowel sounds are normal. He exhibits no distension. There is no tenderness.  Musculoskeletal: He exhibits no edema. Has weakness in all 4 extremities but left > right. Transferred via hoyer lift to [employer]. Has contracture in bilateral UE elbows and wrist Neurological: He is alert.   Skin: Skin is warm and dry.  Psychiatric: He has a normal mood and affect.   Labs  reviewed:  07-24-12: wbc 7.6; hgb 9.9;hct 32.3; mcv 98.2 ;plt 198; glucose 102; bun 43; creat 1.2; k+ 5.1;na++ 141 Liver normal albumin 3.1; hgb a1c 6.5; depakote 20.9 08-28-12: depakote 19.9   02/15/13 wbc 7.3, hb 8.8, hct 29.3, plt 188, na 137, k 4.9, bun 42, cr 1.4, glu 89, ca 8.8  ASSESSMENT/PLAN  Dementia Stable, continue aricept 10 mg daily. Did not tolerate exelon patch.  OBSTRUCTIVE SLEEP APNEA continue O2 by nasal canula to keep o2 sat > 905. Refusing cpap and thus has been discontinued  Iron deficiency anemia Continue ferex 150 mg daily  Type II diabetes mellitus   cbg well controlled. Continue levemir 30 u daily and monitor cbg

## 2013-05-23 DIAGNOSIS — F0390 Unspecified dementia without behavioral disturbance: Secondary | ICD-10-CM | POA: Insufficient documentation

## 2013-05-23 DIAGNOSIS — E1159 Type 2 diabetes mellitus with other circulatory complications: Secondary | ICD-10-CM | POA: Insufficient documentation

## 2013-05-23 DIAGNOSIS — D509 Iron deficiency anemia, unspecified: Secondary | ICD-10-CM | POA: Insufficient documentation

## 2013-05-28 ENCOUNTER — Other Ambulatory Visit: Payer: Self-pay | Admitting: *Deleted

## 2013-05-28 MED ORDER — MORPHINE SULFATE ER 15 MG PO TBCR: EXTENDED_RELEASE_TABLET | ORAL | Status: DC

## 2013-05-28 NOTE — Telephone Encounter (Signed)
rx faxed to [facility] @ [phone]. 

## 2013-06-17 ENCOUNTER — Non-Acute Institutional Stay (SKILLED_NURSING_FACILITY): Payer: PRIVATE HEALTH INSURANCE | Admitting: Internal Medicine

## 2013-06-17 DIAGNOSIS — J209 Acute bronchitis, unspecified: Secondary | ICD-10-CM

## 2013-06-17 DIAGNOSIS — J9610 Chronic respiratory failure, unspecified whether with hypoxia or hypercapnia: Secondary | ICD-10-CM

## 2013-07-15 ENCOUNTER — Encounter: Payer: Self-pay | Admitting: Internal Medicine

## 2013-07-15 ENCOUNTER — Non-Acute Institutional Stay (SKILLED_NURSING_FACILITY): Payer: PRIVATE HEALTH INSURANCE | Admitting: Internal Medicine

## 2013-07-15 ENCOUNTER — Encounter: Payer: Self-pay | Admitting: *Deleted

## 2013-07-15 DIAGNOSIS — D509 Iron deficiency anemia, unspecified: Secondary | ICD-10-CM

## 2013-07-15 DIAGNOSIS — N183 Chronic kidney disease, stage 3 unspecified: Secondary | ICD-10-CM

## 2013-07-15 DIAGNOSIS — E1129 Type 2 diabetes mellitus with other diabetic kidney complication: Secondary | ICD-10-CM

## 2013-07-27 ENCOUNTER — Other Ambulatory Visit: Payer: Self-pay | Admitting: *Deleted

## 2013-07-27 MED ORDER — OXYCODONE-ACETAMINOPHEN 5-325 MG PO TABS: ORAL_TABLET | ORAL | Status: DC

## 2013-07-27 NOTE — Telephone Encounter (Signed)
[facility] 

## 2013-07-27 NOTE — Progress Notes (Signed)
This encounter was created in error - please disregard.

## 2013-08-02 ENCOUNTER — Non-Acute Institutional Stay (SKILLED_NURSING_FACILITY): Payer: PRIVATE HEALTH INSURANCE | Admitting: Internal Medicine

## 2013-08-02 DIAGNOSIS — K219 Gastro-esophageal reflux disease without esophagitis: Secondary | ICD-10-CM

## 2013-08-02 DIAGNOSIS — D509 Iron deficiency anemia, unspecified: Secondary | ICD-10-CM

## 2013-08-02 DIAGNOSIS — F319 Bipolar disorder, unspecified: Secondary | ICD-10-CM

## 2013-08-03 ENCOUNTER — Other Ambulatory Visit: Payer: Self-pay | Admitting: *Deleted

## 2013-08-03 MED ORDER — MORPHINE SULFATE ER 15 MG PO TBCR: EXTENDED_RELEASE_TABLET | ORAL | Status: DC

## 2013-08-03 NOTE — Telephone Encounter (Signed)
[facility] 

## 2013-08-30 ENCOUNTER — Non-Acute Institutional Stay (SKILLED_NURSING_FACILITY): Payer: PRIVATE HEALTH INSURANCE | Admitting: Internal Medicine

## 2013-08-30 DIAGNOSIS — G4733 Obstructive sleep apnea (adult) (pediatric): Secondary | ICD-10-CM

## 2013-08-30 DIAGNOSIS — N183 Chronic kidney disease, stage 3 unspecified: Secondary | ICD-10-CM

## 2013-08-30 DIAGNOSIS — N039 Chronic nephritic syndrome with unspecified morphologic changes: Secondary | ICD-10-CM

## 2013-08-30 DIAGNOSIS — I130 Hypertensive heart and chronic kidney disease with heart failure and stage 1 through stage 4 chronic kidney disease, or unspecified chronic kidney disease: Secondary | ICD-10-CM

## 2013-08-30 DIAGNOSIS — I509 Heart failure, unspecified: Secondary | ICD-10-CM

## 2013-08-30 DIAGNOSIS — I4891 Unspecified atrial fibrillation: Secondary | ICD-10-CM

## 2013-08-30 DIAGNOSIS — I482 Chronic atrial fibrillation, unspecified: Secondary | ICD-10-CM

## 2013-09-02 ENCOUNTER — Other Ambulatory Visit: Payer: Self-pay | Admitting: *Deleted

## 2013-09-02 MED ORDER — OXYCODONE-ACETAMINOPHEN 5-325 MG PO TABS
ORAL_TABLET | ORAL | Status: DC
Start: 2013-09-02 — End: 2013-11-03

## 2013-09-02 MED ORDER — MORPHINE SULFATE ER 15 MG PO TBCR: EXTENDED_RELEASE_TABLET | ORAL | Status: DC

## 2013-09-02 NOTE — Telephone Encounter (Signed)
[facility] 

## 2013-09-05 DIAGNOSIS — J9610 Chronic respiratory failure, unspecified whether with hypoxia or hypercapnia: Secondary | ICD-10-CM | POA: Insufficient documentation

## 2013-09-05 DIAGNOSIS — J209 Acute bronchitis, unspecified: Secondary | ICD-10-CM | POA: Insufficient documentation

## 2013-09-05 NOTE — Progress Notes (Signed)
Patient ID: [name] Weida, male   DOB: 06/13/1932, 80 y.o.   MRN: [id number]    [facility]  Code Status: DNR  Chief complaint- medical management of chronic illness  Allergies reviewed  HPI:   80 y/o male patient is a long term resident seen today for medical management of chronic illness. He recently was treated for acute bronchitis. He is on oxygen and breathing is stable. Denies any concerns this visit. No concerns from staff.  No falls reported. No new skin concerns. No new behavioral concerns. Appetite is fair. He is under complete care  Review of Systems:  Denies chest pain Denies SOB Denies nausea and vomiting Denies abdominal pain No urinary complaints On chronic anticoagulation    Medication reviewed. See MAR No past surgical history on file.  Physical exam BP 114/62  Pulse 61  Temp(Src) 98 F (36.7 [name])  Resp 18  SpO2 96%  Constitutional: He appears well-developed and well-nourished. obese  Neck: Neck supple. No tracheal deviation present. No thyromegaly present.   Respiratory: Effort normal and breath sounds normal. No respiratory distress. He has no wheezes.   GI: Soft. Bowel sounds are normal. He exhibits no distension. There is no tenderness.  Musculoskeletal: He exhibits no edema. Has weakness in all 4 extremities but left > right. Transferred via hoyer lift to [employer]. Has contracture in bilateral UE elbows and wrist Neurological: He is alert.   Skin: Skin is warm and dry.  Psychiatric: He has a normal mood and affect.   Labs reviewed:  07-24-12: wbc 7.6; hgb 9.9;hct 32.3; mcv 98.2 ;plt 198; glucose 102; bun 43; creat 1.2; k+ 5.1;na++ 141 Liver normal albumin 3.1; hgb a1c 6.5; depakote 20.9 08-28-12: depakote 19.9   02/15/13 wbc 7.3, hb 8.8, hct 29.3, plt 188, na 137, k 4.9, bun 42, cr 1.4, glu 89, ca 8.8 05/17/13 wbc 6.5, hb 9.4, hct 31.8, plt 199, na 138, k 4.7, bun 35, cr 1.3, glu 115, ca 8.5, a1c 6.4, bnp  33  ASSESSMENT/PLAN  Chronic respiratory failure His OHS/OSA and chf could likely be contributing to this. Continue his oxygen. Refuses cpap. He most likely has a component of pulmonary HTN as well. If symptoms persis or worsens, will get echocardiogrma  Acute bronchitis Off levaquin, clinically stable, monitor clinically

## 2013-10-07 ENCOUNTER — Encounter: Payer: Self-pay | Admitting: Internal Medicine

## 2013-10-07 ENCOUNTER — Non-Acute Institutional Stay (SKILLED_NURSING_FACILITY): Payer: PRIVATE HEALTH INSURANCE | Admitting: Internal Medicine

## 2013-10-07 DIAGNOSIS — I130 Hypertensive heart and chronic kidney disease with heart failure and stage 1 through stage 4 chronic kidney disease, or unspecified chronic kidney disease: Secondary | ICD-10-CM | POA: Insufficient documentation

## 2013-10-07 DIAGNOSIS — N039 Chronic nephritic syndrome with unspecified morphologic changes: Secondary | ICD-10-CM

## 2013-10-07 DIAGNOSIS — I509 Heart failure, unspecified: Secondary | ICD-10-CM

## 2013-10-07 DIAGNOSIS — E1129 Type 2 diabetes mellitus with other diabetic kidney complication: Secondary | ICD-10-CM | POA: Insufficient documentation

## 2013-10-07 DIAGNOSIS — I4891 Unspecified atrial fibrillation: Secondary | ICD-10-CM

## 2013-10-07 DIAGNOSIS — I482 Chronic atrial fibrillation, unspecified: Secondary | ICD-10-CM

## 2013-10-07 NOTE — Progress Notes (Signed)
Patient ID: Peter JubaLandon C [name], male   DOB: 08/06/1932, 80 y.o.   MRN: [id number]    [facility]  Code Status: DNR  Chief complaint- medical management of chronic illness  Allergies reviewed  HPI:   80 y/o male patient is a long term resident seen today for medical management of chronic illness. He has chf, afib, dm, ckd among others. His recent a1c 6.3. cbg below 200. bp controlled.He has no concerns this visit. He is on multiple pain medication regimen and in no distress. No concerns from staff.  No falls reported. He is under complete care  Review of Systems:  Denies chest pain Denies SOB Denies nausea and vomiting Denies abdominal pain On chronic anticoagulation  Past Medical History  Diagnosis Date  . Hypertension   . CHF (congestive heart failure)     H/o right sided CHF   . Arthritis   . Alzheimer's dementia   . Bipolar 1 disorder   . PVD (peripheral vascular disease)   . DVT (deep venous thrombosis)   . Atrial fibrillation   . CVA (cerebral infarction) 2004    right sided  . Dyslipidemia   . Diabetes mellitus   . Venous stasis ulcers    [facility]  Physical exam BP 114/60  Pulse 60  Temp(Src) 98 F (36.7 C)  Resp 18  Wt 254 lb 6.4 oz (115.395 kg)  SpO2 95%  Constitutional: He appears well-developed and well-nourished. obese  Neck: Neck supple. No tracheal deviation present. No thyromegaly present.   Respiratory: Effort normal and breath sounds normal. No respiratory distress. He has no wheezes.   GI: Soft. Bowel sounds are normal. He exhibits no distension. There is no tenderness.  Musculoskeletal: He exhibits no edema. Has weakness in all 4 extremities but left > right. Transferred via hoyer lift to [employer]. Has contracture in bilateral UE elbows and wrist Neurological: He is alert.   Skin: Skin is warm and dry.  Psychiatric: He has a normal mood and affect.   Labs reviewed:  08/09/13 wbc 6.4, hb 8.7, mcv 97.6,  plt 150 08/23/13 wbc 7 hb 9.2, hct 32, plt 156         Assessment/plan  chf Stable, continue carvedilol 3.125 mg bid with lasix 20 mg bid and lisinopril 2.5 mg daily.  HTN bp controlled. Continue carvedilol and lisinopril, monitor bp readings.   afib Rate controlled. Continue coreg and coumadin, goal inr 2-3  Dm type 2 with renal disease a1c controlled, cbg controlled, continue levemir 26 u , monitor cbg and continue ACEI

## 2013-10-11 DIAGNOSIS — F319 Bipolar disorder, unspecified: Secondary | ICD-10-CM | POA: Insufficient documentation

## 2013-10-11 DIAGNOSIS — K219 Gastro-esophageal reflux disease without esophagitis: Secondary | ICD-10-CM | POA: Insufficient documentation

## 2013-10-11 DIAGNOSIS — N183 Chronic kidney disease, stage 3 unspecified: Secondary | ICD-10-CM | POA: Insufficient documentation

## 2013-10-11 NOTE — Progress Notes (Signed)
This encounter was created in error - please disregard.

## 2013-10-11 NOTE — Progress Notes (Signed)
Patient ID: [name], male   DOB: 1933/01/31, 80 y.o.   MRN: [id number]    [facility]  Code Status: DNR  Chief complaint- medical management of chronic illness  Allergies reviewed  HPI:   80 y/o male patient is seen today for medical management of chronic illness.He is on multiple pain medication regimen and in no distress. No concerns from staff.  No falls reported. No new skin concerns. he is under complete care  Review of Systems:  Denies chest pain Denies SOB Denies nausea and vomiting Denies abdominal pain On chronic anticoagulation Behavior calm  Past Medical History  Diagnosis Date  . Hypertension   . CHF (congestive heart failure)     H/o right sided CHF   . Arthritis   . Alzheimer's dementia   . Bipolar 1 disorder   . PVD (peripheral vascular disease)   . DVT (deep venous thrombosis)   . Atrial fibrillation   . CVA (cerebral infarction) 2004    right sided  . Dyslipidemia   . Diabetes mellitus   . Venous stasis ulcers    Medication reviewed. See [facility]  Physical exam BP 133/60  Pulse 84  Temp(Src) 98 F (36.7 C)  Resp 18  SpO2 95%  Constitutional: He appears well-developed and well-nourished. obese  Neck: Neck supple. No tracheal deviation present. No thyromegaly present.   Respiratory: Effort normal and breath sounds normal. No respiratory distress. He has no wheezes.   GI: Soft. Bowel sounds are normal. He exhibits no distension. There is no tenderness.  Musculoskeletal: He exhibits no edema. Has weakness in all 4 extremities but left > right. Transferred via hoyer lift to [employer]. Has contracture in bilateral UE elbows and wrist Neurological: He is alert.   Skin: Skin is warm and dry.  Psychiatric: He has a normal mood and affect.   Labs reviewed:  07-24-12: wbc 7.6; hgb 9.9;hct 32.3; mcv 98.2 ;plt 198; glucose 102; bun 43; creat 1.2; k+ 5.1;na++ 141 Liver normal albumin 3.1; hgb a1c 6.5; depakote 20.9 08-28-12:  depakote 19.9   02/15/13 wbc 7.3, hb 8.8, hct 29.3, plt 188, na 137, k 4.9, bun 42, cr 1.4, glu 89, ca 8.8 08/23/13 wbc 7 hb 9.2, hct 32, plt 156         Assessment/plan  ckd stage 3 Continue ACEI, monitor renal function  afib Rate controlled, continue carvedilol and coumadin. Goal inr 2-3  OSA Not on cpap due to refusal. Continue o2. Worsening anticiapted   HTN Continue lisinopril and carvedilol, monitor bmp, continue bp checks

## 2013-10-11 NOTE — Progress Notes (Signed)
Patient ID: [name], male   DOB: [birth year], 80 y.o.   MRN: [id number]    [facility]  Code Status: DNR  Chief complaint- medical management of chronic illness  Allergies reviewed  HPI:   80 y/o male patient is a long term resident seen today for medical management of chronic illness.No concerns from staff.  No falls reported. No new skin concerns. No new behavioral concerns. Appetite is fair. He is under complete care  Review of Systems:  Denies chest pain Denies SOB Denies nausea and vomiting Denies abdominal pain No urinary complaints On chronic anticoagulation    Medication reviewed. See [facility]  Physical exam BP 129/73  Pulse 64  Temp(Src) 98.4 F (36.9 C)  Resp 18  Wt 252 lb 3.2 oz (114.397 kg)  Constitutional: He appears well-developed and well-nourished. obese  Neck: Neck supple. No tracheal deviation present. No thyromegaly present.   Respiratory: Effort normal and breath sounds normal. No respiratory distress. He has no wheezes.   GI: Soft. Bowel sounds are normal. He exhibits no distension. There is no tenderness.  Musculoskeletal: He exhibits no edema. Has weakness in all 4 extremities but left > right. Transferred via hoyer lift to [employer]. Has contracture in bilateral UE elbows and wrist Neurological: He is alert.   Skin: Skin is warm and dry.  Psychiatric: He has a normal mood and affect.   Labs reviewed:  07-24-12: wbc 7.6; hgb 9.9;hct 32.3; mcv 98.2 ;plt 198; glucose 102; bun 43; creat 1.2; k+ 5.1;na++ 141 Liver normal albumin 3.1; hgb a1c 6.5; depakote 20.9 08-28-12: depakote 19.9   02/15/13 wbc 7.3, hb 8.8, hct 29.3, plt 188, na 137, k 4.9, bun 42, cr 1.4, glu 89, ca 8.8 07/26/13 hb 8.8, wbc 6.7, na 140, k 4.9, cr 1.1, glu 72  ASSESSMENT/PLAN  Iron deficiency anemia Continue ferex 150 mg daily and continue his ppi  Bipolar disorder Continue depakote sprinkles and prn xanax for now. Calm this visit. Monitor  clinically  GERD Continue prilosec 20 mg daily for now

## 2013-10-11 NOTE — Progress Notes (Signed)
Patient ID: [name], male   DOB: 1932-05-30, 81 y.o.   MRN: [id number]    Facility: [facility]  [facility]  Allergies  Allergen Reactions  . Penicillins Shortness Of Breath    Per MAR. "Throat closes up" per daughter    HPI 80 y/o male pt seen for routine visit. He is a long term care resident under total care with chf, OSA, HTN, morbid obesity among others. He is hoyer transfer and has chronic pain syndrome. He is in no distress and denies any complaints today.  ROS No new concern from staff No fever or chills No chest pain and dyspnea at rest No nausea, vomiting, abdominal pain  Past Medical History  Diagnosis Date  . Hypertension   . CHF (congestive heart failure)     H/o right sided CHF   . Arthritis   . Alzheimer's dementia   . Bipolar 1 disorder   . PVD (peripheral vascular disease)   . DVT (deep venous thrombosis)   . Atrial fibrillation   . CVA (cerebral infarction) 2004    right sided  . Dyslipidemia   . Diabetes mellitus   . Venous stasis ulcers    Medication reviewed. See [facility]  Physical exam BP 120/65  Pulse 85  Temp(Src) 97 F (36.1 C)  Resp 19'  Constitutional: He appears well-developed and well-nourished. obese  Neck: Neck supple. No tracheal deviation present. No thyromegaly present.   Respiratory: Effort normal and breath sounds normal. No respiratory distress. He has no wheezes.   GI: Soft. Bowel sounds are normal. He exhibits no distension. There is no tenderness.  Musculoskeletal: He exhibits no edema. Has weakness in all 4 extremities but left > right. Transferred via hoyer lift to [employer]. Has contracture in bilateral UE elbows and wrist Neurological: He is alert.   Skin: Skin is warm and dry.  Psychiatric: He has a normal mood and affect.   ASSESSMENT/PLAN  Type II diabetes mellitus   cbg well controlled. Continue levemir and lisinopril for now. Monitor cbg  Iron def anemia Continue ferrex,  monitor h7h. On coumadin  CKD stage 3 Continue lisinopril 2.5 mg daily and monitor renal function., his DM and HTN have contributed to this. Keep sugar under control

## 2013-11-02 ENCOUNTER — Other Ambulatory Visit: Payer: Self-pay | Admitting: *Deleted

## 2013-11-02 MED ORDER — MORPHINE SULFATE ER 15 MG PO TBCR: EXTENDED_RELEASE_TABLET | ORAL | Status: DC

## 2013-11-02 NOTE — Telephone Encounter (Signed)
[facility] 

## 2013-11-03 ENCOUNTER — Other Ambulatory Visit: Payer: Self-pay | Admitting: *Deleted

## 2013-11-03 MED ORDER — OXYCODONE-ACETAMINOPHEN 5-325 MG PO TABS: ORAL_TABLET | ORAL | Status: DC

## 2013-11-03 NOTE — Telephone Encounter (Signed)
[facility] 

## 2013-11-15 ENCOUNTER — Other Ambulatory Visit: Payer: Self-pay | Admitting: *Deleted

## 2013-11-15 ENCOUNTER — Non-Acute Institutional Stay (SKILLED_NURSING_FACILITY): Payer: PRIVATE HEALTH INSURANCE | Admitting: Internal Medicine

## 2013-11-15 DIAGNOSIS — K219 Gastro-esophageal reflux disease without esophagitis: Secondary | ICD-10-CM

## 2013-11-15 DIAGNOSIS — F319 Bipolar disorder, unspecified: Secondary | ICD-10-CM

## 2013-11-15 DIAGNOSIS — J9610 Chronic respiratory failure, unspecified whether with hypoxia or hypercapnia: Secondary | ICD-10-CM

## 2013-11-15 DIAGNOSIS — D509 Iron deficiency anemia, unspecified: Secondary | ICD-10-CM

## 2013-11-15 MED ORDER — OXYCODONE-ACETAMINOPHEN 5-325 MG PO TABS: ORAL_TABLET | ORAL | Status: DC

## 2013-11-15 NOTE — Progress Notes (Signed)
Patient ID: [name], male   DOB: Jul 03, 1932, 80 y.o.   MRN: [id number]    [facility]  Code Status: DNR  Chief complaint- medical management of chronic illness  Allergies reviewed  HPI:   80 y/o male patient seen for RV He has no concerns this visit. He is in no distress. No concerns from staff.  No falls reported. No new skin concerns. No new behavioral concerns. Appetite is fair. He is under complete care and on chronic o2. Can make his needs known  Review of Systems:  Denies chest pain Denies SOB Denies nausea and vomiting Denies abdominal pain No urinary complaints On chronic anticoagulation  Past Medical History  Diagnosis Date  . Hypertension   . CHF (congestive heart failure)     H/o right sided CHF   . Arthritis   . Alzheimer's dementia   . Bipolar 1 disorder   . PVD (peripheral vascular disease)   . DVT (deep venous thrombosis)   . Atrial fibrillation   . CVA (cerebral infarction) 2004    right sided  . Dyslipidemia   . Diabetes mellitus   . Venous stasis ulcers    Medication reviewed. See [facility]  Physical exam BP 122/60  Pulse 60  Temp(Src) 97 F (36.1 C)  Resp 18  Wt 245 lb (111.131 kg)  SpO2 95%  Constitutional: He appears well-developed and well-nourished. obese  Neck: Neck supple. No tracheal deviation present. No thyromegaly present.   Respiratory: Effort normal and breath sounds normal. No respiratory distress. He has no wheezes.   GI: Soft. Bowel sounds are normal. He exhibits no distension. There is no tenderness.  Musculoskeletal: He exhibits no edema. Has weakness in all 4 extremities but left > right. Transferred via hoyer lift to [employer]. Has contracture in bilateral UE elbows and wrist Neurological: He is alert.   Skin: Skin is warm and dry.  Psychiatric: He has a normal mood and affect.   08/23/13 wbc 7 hb 9.2, hct 32, plt 156  Assessment/plan  1. Gastroesophageal reflux disease, esophagitis presence not  specified Continue prilosec, symptom controlled  2. Chronic respiratory failure, unspecified whether with hypoxia or hypercapnia On o2- chf, obesity hypoventilation syndrome  3. Iron deficiency anemia, unspecified Continue ferrous sulfate and monitor h&h  4. Bipolar disorder, unspecified continue depakote and ativan, monitor clinically

## 2013-11-15 NOTE — Telephone Encounter (Signed)
[facility] 

## 2013-11-30 ENCOUNTER — Other Ambulatory Visit: Payer: Self-pay | Admitting: *Deleted

## 2013-11-30 MED ORDER — MORPHINE SULFATE ER 15 MG PO TBCR: EXTENDED_RELEASE_TABLET | ORAL | Status: DC

## 2013-11-30 NOTE — Telephone Encounter (Signed)
[facility] 

## 2013-12-17 ENCOUNTER — Non-Acute Institutional Stay (SKILLED_NURSING_FACILITY): Payer: PRIVATE HEALTH INSURANCE | Admitting: Internal Medicine

## 2013-12-17 DIAGNOSIS — I10 Essential (primary) hypertension: Secondary | ICD-10-CM

## 2013-12-17 DIAGNOSIS — I110 Hypertensive heart disease with heart failure: Secondary | ICD-10-CM

## 2013-12-17 DIAGNOSIS — G4733 Obstructive sleep apnea (adult) (pediatric): Secondary | ICD-10-CM

## 2013-12-17 DIAGNOSIS — F0150 Vascular dementia without behavioral disturbance: Secondary | ICD-10-CM

## 2013-12-17 DIAGNOSIS — I509 Heart failure, unspecified: Secondary | ICD-10-CM

## 2013-12-17 DIAGNOSIS — E1122 Type 2 diabetes mellitus with diabetic chronic kidney disease: Secondary | ICD-10-CM

## 2013-12-17 DIAGNOSIS — I482 Chronic atrial fibrillation, unspecified: Secondary | ICD-10-CM

## 2013-12-17 DIAGNOSIS — N183 Chronic kidney disease, stage 3 unspecified: Secondary | ICD-10-CM

## 2013-12-18 ENCOUNTER — Encounter: Payer: Self-pay | Admitting: Internal Medicine

## 2013-12-18 DIAGNOSIS — E1122 Type 2 diabetes mellitus with diabetic chronic kidney disease: Secondary | ICD-10-CM | POA: Insufficient documentation

## 2013-12-18 DIAGNOSIS — G4733 Obstructive sleep apnea (adult) (pediatric): Secondary | ICD-10-CM | POA: Insufficient documentation

## 2013-12-18 DIAGNOSIS — N183 Chronic kidney disease, stage 3 unspecified: Secondary | ICD-10-CM | POA: Insufficient documentation

## 2013-12-18 DIAGNOSIS — I110 Hypertensive heart disease with heart failure: Secondary | ICD-10-CM | POA: Insufficient documentation

## 2013-12-18 DIAGNOSIS — F0150 Vascular dementia without behavioral disturbance: Secondary | ICD-10-CM | POA: Insufficient documentation

## 2013-12-18 DIAGNOSIS — I10 Essential (primary) hypertension: Secondary | ICD-10-CM | POA: Insufficient documentation

## 2013-12-18 NOTE — Progress Notes (Signed)
Patient ID: [name], male   DOB: 02/28/1932, 80 y.o.   MRN: [id number]    [facility]  Code Status: DNR  Chief complaint- annual exam  Allergies reviewed  HPI:   80 y/o male patient is seen for his annual exam. He has history of ckd, dm, HTN, OSA, CHF, cva and afib on coumadin among others. He is mainly in bed per patient's choice. He is alert and oriented and in no distress this am. He is working with restorative for PROM and has palm protectors in place. uptodate with flu vaccine. Reviewed his a1c and cbg 70-120. Continues to be on oxygen. Denies any concerns today. No concern from staff.  No falls reported. No new skin concerns. No new behavioral concerns. Appetite is fair.   Review of Systems:  Constitutional: Negative for fever, chills, diaphoresis.  HENT: Negative for congestion and sore throat.   Eyes: Negative for blurred vision, double vision and discharge.  Respiratory: Negative for cough, sputum production, shortness of breath and wheezing. On chronic oxygen  Cardiovascular: Negative for chest pain, palpitations, leg swelling.  Gastrointestinal: Negative for heartburn, nausea, vomiting, abdominal pain, diarrhea and constipation.  Genitourinary: Negative for dysuria, flank pain.  Musculoskeletal: Negative for falls. Chronic pain under control Skin: Negative for itching and rash.  Neurological: Negative for dizziness, headaches.  Psychiatric/Behavioral: Negative for depression. Denies insomnia  Past Medical History  Diagnosis Date  . Hypertension   . CHF (congestive heart failure)     H/o right sided CHF   . Arthritis   . Alzheimer's dementia   . Bipolar 1 disorder   . PVD (peripheral vascular disease)   . DVT (deep venous thrombosis)   . Atrial fibrillation   . CVA (cerebral infarction) 2004    right sided  . Dyslipidemia   . Diabetes mellitus   . Venous stasis ulcers    Current Outpatient Prescriptions on File Prior to Visit    Medication Sig Dispense Refill  . carboxymethylcellulose (REFRESH TEARS) 0.5 % SOLN Place 2 drops into both eyes every 2 (two) hours as needed. FOR DRY EYES       . carvedilol (COREG) 12.5 MG tablet Take 12.5 mg by mouth daily. TAKE WITH FOOD       . donepezil (ARICEPT) 10 MG tablet Take 10 mg by mouth at bedtime.        . DULoxetine (CYMBALTA) 60 MG capsule Take 90 mg by mouth daily. For depression      . furosemide (LASIX) 20 MG tablet Take 20 mg by mouth 2 (two) times daily. TAKE AT 1700 DAILY      . insulin detemir (LEVEMIR) 100 UNIT/ML injection Inject 30 Units into the skin at bedtime.       Marland Kitchen. lisinopril (PRINIVIL,ZESTRIL) 5 MG tablet Take 5 mg by mouth daily.        Marland Kitchen. morphine (MS CONTIN) 15 MG 12 hr tablet Take one tablet by mouth every eight hours for pain s/p CVA with left hemiplegia. Do not crush  90 tablet  0  . morphine (MSIR) 15 MG tablet Take 1 tablet (15 mg total) by mouth every 8 (eight) hours.  90 tablet  0  . omeprazole (PRILOSEC) 20 MG capsule Take 20 mg by mouth daily.        Marland Kitchen. oxyCODONE-acetaminophen (PERCOCET/ROXICET) 5-325 MG per tablet Take one tablet by mouth every 4 hours as needed for pain  180 tablet  0  . senna-docusate (SENOKOT-S) 8.6-50 MG  per tablet Take 1 tablet by mouth 2 (two) times daily.        . Valproic Acid (DEPAKENE) 250 MG/5ML SYRP syrup Take 250-500 mg by mouth 2 (two) times daily. 250 mg in the am and 500 mg in the pm      . warfarin (COUMADIN) 6 MG tablet Take 1 tablet (6 mg total) by mouth daily.  30 tablet  3   No current facility-administered medications on file prior to visit.   History reviewed. No pertinent past surgical history.  Physical exam BP 128/70  Pulse 68  Temp(Src) 98.1 F (36.7 C)  Resp 18  Ht 5\' 10"  (1.778 m)  Wt 249 lb 9.6 oz (113.218 kg)  BMI 35.81 kg/m2  SpO2 95%  General- elderly obese male in no acute distress Head- atraumatic, normocephalic Eyes- PERRLA, EOMI, no pallor, no icterus, no discharge Ears- normal  external ear canal  Neck- no lymphadenopathy, no thyromegaly, no jugular vein distension, no carotid bruit Nose- normal nasal mucosa, no maxillary sinus tenderness, no frontal sinus tenderness Mouth- normal mucus membrane, no oral thrush, normal oropharynx Cardiovascular- normal s1,s2, no murmurs/ rubs/ gallops, diminished dorsalis pedis pulses, no leg edema Respiratory- bilateral clear to auscultation, no wheeze, no rhonchi, no crackles Abdomen- bowel sounds present, soft, non tender, no guarding or rigidity Musculoskeletal- bilateral upper extremities contracture at elbows and hands, transferred via hoyer lift, needs assistance with feeding, ROM limited in UE and LE Neurological- alert and oriented to person and place Skin- warm and dry Psychiatry- normal mood and affect   Labs 08/23/13 wbc 7 hb 9.2, hct 32, plt 156 11/01/13 a1c 5.8  Assessment/plan  Annual exam uptodate with influenza vaccine 11/23/13, pnuemococcal vaccine 02/13/09, diabetic foot exam 09/15/13 and eye exam 11/15/13. Negative for diabetic retinopathy. Encouraged to be out of bed but patient refuses. Continue restorative PROM.  Dm type 2 with renal manifestations a1c s/o controlled dm. Reduction of levemir to 20 u with controlled a1c and cbg readings. Monitor cbg. Continue lisinopril 2.5 mg daily. Not on statin with family refusing it.   ckd stage 3 Continue ACEI for renal remodelling. Pt on lasix, monitor renal function  CHF euvolemic on exam, symptoms controlled. Continue carvedilol, lisinopril and lasix, monitor renal function  HTN Controlled, continue current regimen, no changes made  OSA Chronic. Patient not using CPAP. Continue o2 supplement.   afib Rate controlled. Continue carvedilol and coumadin. Monitor inr  Vascular dementia Continue donepezil, monitor clinically, bp controlled, statin declined

## 2013-12-22 ENCOUNTER — Other Ambulatory Visit: Payer: Self-pay | Admitting: *Deleted

## 2013-12-22 MED ORDER — OXYCODONE-ACETAMINOPHEN 5-325 MG PO TABS: ORAL_TABLET | ORAL | Status: DC

## 2013-12-22 NOTE — Telephone Encounter (Signed)
[facility] 

## 2013-12-31 ENCOUNTER — Other Ambulatory Visit: Payer: Self-pay | Admitting: *Deleted

## 2013-12-31 MED ORDER — MORPHINE SULFATE ER 15 MG PO TBCR: EXTENDED_RELEASE_TABLET | ORAL | Status: DC

## 2013-12-31 NOTE — Telephone Encounter (Signed)
[facility] 

## 2014-01-13 ENCOUNTER — Non-Acute Institutional Stay (SKILLED_NURSING_FACILITY): Payer: PRIVATE HEALTH INSURANCE | Admitting: Internal Medicine

## 2014-01-13 DIAGNOSIS — N183 Chronic kidney disease, stage 3 unspecified: Secondary | ICD-10-CM

## 2014-01-13 DIAGNOSIS — I482 Chronic atrial fibrillation, unspecified: Secondary | ICD-10-CM

## 2014-01-13 DIAGNOSIS — I10 Essential (primary) hypertension: Secondary | ICD-10-CM

## 2014-01-13 NOTE — Progress Notes (Signed)
Patient ID: [name], male   DOB: 03/11/1932, 80 y.o.   MRN: [id number]    [facility]  Code Status: DNR  Chief complaint- routine visit  Allergies reviewed  HPI:   80 y/o male patient with history of ckd, dm, HTN, OSA, CHF, cva and afib on coumadin is seen for routine visit. He is mainly in bed per patient's choice. He is alert and oriented and in no distress this am. Continues to be on oxygen. Denies any concerns today. No concern from staff.  under total care. No falls reported. No new skin concerns. No new behavioral concerns. Appetite is fair.   Review of Systems:  Constitutional: Negative for fever, chills, diaphoresis.  Respiratory: Negative for cough, shortness of breath and wheezing. On chronic oxygen  Cardiovascular: Negative for chest pain, palpitations, leg swelling.  Gastrointestinal: Negative for heartburn, nausea, vomiting, abdominal pain, diarrhea and constipation.  Genitourinary: Negative for dysuria, flank pain.  Musculoskeletal: Negative for falls. Chronic pain under control Skin: Negative for itching and rash.  Neurological: Negative for dizziness, headaches.  Psychiatric/Behavioral: Negative for depression. Denies insomnia  Past Medical History  Diagnosis Date  . Hypertension   . CHF (congestive heart failure)     H/o right sided CHF   . Arthritis   . Alzheimer's dementia   . Bipolar 1 disorder   . PVD (peripheral vascular disease)   . DVT (deep venous thrombosis)   . Atrial fibrillation   . CVA (cerebral infarction) 2004    right sided  . Dyslipidemia   . Diabetes mellitus   . Venous stasis ulcers    [facility]  Physical exam bp 130/68, HR 77/min, RR 20/min, afebrile  General- elderly obese male in no acute distress Head- atraumatic, normocephalic Neck- no lymphadenopathy Cardiovascular- normal s1,s2, no murmurs, no leg edema Respiratory- bilateral clear to auscultation, no wheeze, no rhonchi, no  crackles Abdomen- bowel sounds present, soft, non tender, no guarding or rigidity Musculoskeletal- bilateral upper extremities contracture at elbows and hands, transferred via hoyer lift, needs assistance with feeding, ROM limited in UE and LE Neurological- alert and oriented to person and place Skin- warm and dry Psychiatry- normal mood and affect   Labs 08/23/13 wbc 7 hb 9.2, hct 32, plt 156 11/01/13 a1c 5.8  Assessment/plan  ckd stage 3 on lasix, monitor renal function  HTN Controlled, continue lisinopril 2.5 mg daily, coreg 3.125 mg bid and lasix 20 mg bid  afib Rate controlled. Continue carvedilol and coumadin. Monitor inr

## 2014-02-03 ENCOUNTER — Other Ambulatory Visit: Payer: Self-pay | Admitting: *Deleted

## 2014-02-03 MED ORDER — MORPHINE SULFATE ER 15 MG PO TBCR: EXTENDED_RELEASE_TABLET | ORAL | Status: DC

## 2014-02-03 NOTE — Telephone Encounter (Signed)
[facility] 

## 2014-02-17 ENCOUNTER — Non-Acute Institutional Stay (SKILLED_NURSING_FACILITY): Payer: PRIVATE HEALTH INSURANCE | Admitting: Internal Medicine

## 2014-02-17 DIAGNOSIS — N3000 Acute cystitis without hematuria: Secondary | ICD-10-CM

## 2014-02-17 DIAGNOSIS — B370 Candidal stomatitis: Secondary | ICD-10-CM

## 2014-02-17 DIAGNOSIS — J209 Acute bronchitis, unspecified: Secondary | ICD-10-CM

## 2014-02-17 DIAGNOSIS — N189 Chronic kidney disease, unspecified: Secondary | ICD-10-CM

## 2014-02-17 DIAGNOSIS — L89150 Pressure ulcer of sacral region, unstageable: Secondary | ICD-10-CM

## 2014-02-17 DIAGNOSIS — E1122 Type 2 diabetes mellitus with diabetic chronic kidney disease: Secondary | ICD-10-CM

## 2014-03-04 ENCOUNTER — Other Ambulatory Visit: Payer: Self-pay | Admitting: *Deleted

## 2014-03-04 MED ORDER — MORPHINE SULFATE ER 15 MG PO TBCR: EXTENDED_RELEASE_TABLET | ORAL | Status: DC

## 2014-03-04 NOTE — Telephone Encounter (Signed)
[facility] 

## 2014-03-18 DIAGNOSIS — E1122 Type 2 diabetes mellitus with diabetic chronic kidney disease: Secondary | ICD-10-CM | POA: Insufficient documentation

## 2014-03-18 DIAGNOSIS — L98429 Non-pressure chronic ulcer of back with unspecified severity: Secondary | ICD-10-CM | POA: Insufficient documentation

## 2014-03-18 NOTE — Progress Notes (Signed)
Patient ID: Peter JubaLandon C [name], male   DOB: 07/17/1932, 81 y.o.   MRN: [id number]    [facility]  Code Status: DNR  Chief complaint- routine visit  Allergies reviewed  HPI:   81 y/o male patient is seen for RV. He has history of ckd, dm, HTN, OSA, CHF, cva and afib on coumadin among others. He is mainly in bed per patient's choice. He is alert and oriented and in no distress this am. Continues to be on oxygen. Denies any concerns today. No concern from staff. He has completed course of levaquin for bronchitis and is now on rocephin for UTI with providencia. He also has unstageable pressure ulcer in his sacral area.  No falls reported. No new behavioral concerns. Appetite is fair.   Review of Systems:  Constitutional: Negative for fever, chills, diaphoresis.  HENT: Negative for congestion and sore throat.   Eyes: Negative for blurred vision, double vision and discharge.  Respiratory: Negative for shortness of breath and wheezing. On chronic oxygen  Cardiovascular: Negative for chest pain, palpitations, leg swelling.  Gastrointestinal: Negative for heartburn, nausea, vomiting, abdominal pain, diarrhea and constipation.  Genitourinary: Negative for flank pain.  Musculoskeletal: Negative for falls. Chronic pain under control Skin: Negative for itching and rash.  Neurological: Negative for dizziness, headaches.  Psychiatric/Behavioral: Negative for depression. Denies insomnia  Past Medical History  Diagnosis Date  . Hypertension   . CHF (congestive heart failure)     H/o right sided CHF   . Arthritis   . Alzheimer's dementia   . Bipolar 1 disorder   . PVD (peripheral vascular disease)   . DVT (deep venous thrombosis)   . Atrial fibrillation   . CVA (cerebral infarction) 2004    right sided  . Dyslipidemia   . Diabetes mellitus   . Venous stasis ulcers    Medication [facility]. [facility]  Physical exam BP 140/88 mmHg  Pulse 72  Temp(Src) 98 F (36.7 C)   Resp 18  SpO2 95%  General- elderly obese male in no acute distress Head- atraumatic, normocephalic Eyes- PERRLA, EOMI, no pallor, no icterus, no discharge Neck- no lymphadenopathy, no thyromegaly, no jugular vein distension, no carotid bruit Nose- no maxillary sinus tenderness, no frontal sinus tenderness Mouth- normal mucus membrane Cardiovascular- normal s1,s2, no murmurs, diminished dorsalis pedis pulses, no leg edema Respiratory- bilateral poor air entry, no wheeze, no rhonchi, no crackles Abdomen- bowel sounds present, soft, non tender, no guarding or rigidity Musculoskeletal- bilateral upper extremities contracture at elbows and hands, transferred via hoyer lift, needs assistance with feeding, ROM limited in UE and LE Neurological- alert and oriented to person and place Skin- warm and dry Psychiatry- normal mood and affect   Labs 08/23/13 wbc 7 hb 9.2, hct 32, plt 156 11/01/13 a1c 5.8  Assessment/plan  Providencia uti Reviewed culture report. Continue and complete rocephin 1gm daily for a week with florastor. hydration encouraged  Acute bronchitis Completed course of levaquin, clinically improved, monitor. Continue pureed diet with aspiration precuations  Oral thrush Continue nystatin swish and spit with oral hygiene  Sacral pressure ulcer Unstageable, continue pressure ulcer prophylaxis and continue wound care.  Dm type 2 with renal manifestations Continue levemir 20 u with monitoring of cbg readings given his ongoing infection. Continue lisinopril 2.5 mg daily.

## 2014-03-23 ENCOUNTER — Non-Acute Institutional Stay (SKILLED_NURSING_FACILITY): Payer: Medicare Other | Admitting: Registered Nurse

## 2014-03-23 ENCOUNTER — Encounter: Payer: Self-pay | Admitting: Registered Nurse

## 2014-03-23 DIAGNOSIS — Z7189 Other specified counseling: Secondary | ICD-10-CM

## 2014-03-23 DIAGNOSIS — I482 Chronic atrial fibrillation, unspecified: Secondary | ICD-10-CM

## 2014-03-23 NOTE — Progress Notes (Signed)
Patient ID: [name], male   DOB: 02/18/1933, 81 y.o.   MRN: [id number]   [facility]  Allergies  Allergen Reactions  . Penicillins Shortness Of Breath    Per MAR. "Throat closes up" per daughter     Code Status: DNR  Goals of Care: Comfort and Quality of Life/Hospice  Chief Complaint  Patient presents with  . Acute Visit    transfer of care    HPI  81 y.o. male with PMH of HTN, CVA, CHF, AD, bipolar disorder, chronic atrial fibrillation, PVD, DM2 among others is being seen for an acute visit for transfer of care. He is new Hospice patient. [facility] this, [facility] Seen in room today, reported wanting more food as his current meal portion are not enough. Otherwise, no complaints verbalized by patient.  Review of Systems Constitutional: Negative for fever and chills  HENT: Negative for congestion, and sore throat Eyes: Negative for eye pain  Cardiovascular: Negative for chest pain  Respiratory: Negative cough and shortness of breath Gastrointestinal: Negative for nausea and vomiting. Negative for abdominal pain. Endocrine: Positive for polyphagia Musculoskeletal: Negative for back pain. Positive for limited ROM of BUE and BLE  Neurological: Negative for dizziness and headache Skin: Negative for rash.   Psychiatric: Negative for depression  Past Medical History  Diagnosis Date  . Hypertension   . CHF (congestive heart failure)     H/o right sided CHF   . Arthritis   . Alzheimer's dementia   . Bipolar 1 disorder   . PVD (peripheral vascular disease)   . DVT (deep venous thrombosis)   . Atrial fibrillation   . CVA (cerebral infarction) 2004    right sided  . Dyslipidemia   . Diabetes mellitus   . Venous stasis ulcers     No past surgical history on file.  History   Social History  . Marital Status: Married    Spouse Name: N/A    Number of Children: N/A  . Years of Education: N/A   Occupational  History  . Not on file.   Social History Main Topics  . Smoking status: Former Games developermoker  . Smokeless tobacco: Not on file  . Alcohol Use: No  . Drug Use: Not on file  . Sexual Activity: Not on file   Other Topics Concern  . Not on file   Social History Narrative      Medication List       This list is accurate as of: 03/23/14  4:32 PM.  Always use your most recent med list.               carvedilol 12.5 MG tablet  Commonly known as:  COREG  Take 12.5 mg by mouth daily. TAKE WITH FOOD     donepezil 10 MG tablet  Commonly known as:  ARICEPT  Take 10 mg by mouth at bedtime.     DULoxetine 60 MG capsule  Commonly known as:  CYMBALTA  Take 90 mg by mouth daily. For depression     furosemide 20 MG tablet  Commonly known as:  LASIX  Take 20 mg by mouth 2 (two) times daily. TAKE AT 1700 DAILY     insulin detemir 100 UNIT/ML injection  Commonly known as:  LEVEMIR  Inject 30 Units into the skin at bedtime.     lisinopril 5 MG tablet  Commonly known as:  PRINIVIL,ZESTRIL  Take 5 mg by  mouth daily.     morphine 15 MG tablet  Commonly known as:  MSIR  Take 1 tablet (15 mg total) by mouth every 8 (eight) hours.     morphine 15 MG 12 hr tablet  Commonly known as:  MS CONTIN  Take one tablet by mouth every eight hours for pain s/p CVA with left hemiplegia. Do not crush     omeprazole 20 MG capsule  Commonly known as:  PRILOSEC  Take 20 mg by mouth daily.     oxyCODONE-acetaminophen 5-325 MG per tablet  Commonly known as:  PERCOCET/ROXICET  Take one tablet by mouth three times daily for pain; Take one tablet by mouth every 4 hours as needed for pain     REFRESH TEARS 0.5 % Soln  Generic drug:  carboxymethylcellulose  Place 2 drops into both eyes every 2 (two) hours as needed. FOR DRY EYES     senna-docusate 8.6-50 MG per tablet  Commonly known as:  Senokot-S  Take 1 tablet by mouth 2 (two) times daily.     Valproic Acid 250 MG/5ML Syrp syrup  Commonly known as:   DEPAKENE  Take 250-500 mg by mouth 2 (two) times daily. 250 mg in the am and 500 mg in the pm        Physical Exam  BP 122/59 mmHg  Pulse 73  Temp(Src) 97.9 F (36.6 C)  Resp 16  Ht  (1.778 m)  Wt 237 lb (107.502 kg)  BMI 34.01 kg/m2  Constitutional: WDWN elderly male in no acute distress. Answer questions occasionally and follow commands.  HEENT: Normocephalic and atraumatic. PERRL. EOM intact. No icterus. Oral mucosa moist.  Neck: No masses or thyromegaly. No JVD or carotid bruits. Cardiac: Normal S1, S2. Irregularly irregular. without appreciable murmurs, rubs, or gallops. Distal pulses intact. No dependent edema.  Lungs: No respiratory distress. Breath sounds clear bilaterally without rales, rhonchi, or wheezes. Abdomen: Audible bowel sounds in all quadrants. Soft, nontender, nondistended. Musculoskeletal: Bilateral upper extremities contracture with hand splints in place, transferred via hoyer lift, needs assistance with feeding. Limited ROM of all extremities. No joint erythema or tenderness. Skin: Warm and clammy Neurological: Alert and oriented to self Psychiatric: Appropriate mood and affect.   Assessment & Plan 1. Goals of care, counseling/discussion Transition to Hospice service today. Goal of care is comfort and quality of life. No further lab draws per daughter's request.   2. Chronic atrial fibrillation Will discontinue coumadin and place him on aspirin EC  daily. Continue all other meds. Continue to monitor his status.    Family/Staff Communication Plan of care discussed with daughter, Hopsice nurse, and nursing staff. Daughter, Hospice nurse, and nursing staff verbalized understanding and agree with plan of care. No additional questions or concerns reported.    Loura Back, MSN, AGNP-C [facility] [address] [place], [place] [address] [phone] [8am-5pm] After hours: [phone]

## 2014-04-05 ENCOUNTER — Other Ambulatory Visit: Payer: Self-pay | Admitting: *Deleted

## 2014-04-05 MED ORDER — MORPHINE SULFATE ER 15 MG PO TBCR: EXTENDED_RELEASE_TABLET | ORAL | Status: DC

## 2014-04-05 NOTE — Telephone Encounter (Signed)
[facility] 

## 2014-04-13 ENCOUNTER — Non-Acute Institutional Stay (SKILLED_NURSING_FACILITY): Payer: Medicare Other | Admitting: Registered Nurse

## 2014-04-13 ENCOUNTER — Encounter: Payer: Self-pay | Admitting: Registered Nurse

## 2014-04-13 ENCOUNTER — Other Ambulatory Visit: Payer: Self-pay | Admitting: *Deleted

## 2014-04-13 DIAGNOSIS — I110 Hypertensive heart disease with heart failure: Secondary | ICD-10-CM | POA: Diagnosis not present

## 2014-04-13 DIAGNOSIS — I509 Heart failure, unspecified: Secondary | ICD-10-CM

## 2014-04-13 DIAGNOSIS — J9610 Chronic respiratory failure, unspecified whether with hypoxia or hypercapnia: Secondary | ICD-10-CM

## 2014-04-13 DIAGNOSIS — I482 Chronic atrial fibrillation, unspecified: Secondary | ICD-10-CM

## 2014-04-13 DIAGNOSIS — I69354 Hemiplegia and hemiparesis following cerebral infarction affecting left non-dominant side: Secondary | ICD-10-CM

## 2014-04-13 DIAGNOSIS — K219 Gastro-esophageal reflux disease without esophagitis: Secondary | ICD-10-CM | POA: Diagnosis not present

## 2014-04-13 DIAGNOSIS — N183 Chronic kidney disease, stage 3 unspecified: Secondary | ICD-10-CM

## 2014-04-13 DIAGNOSIS — K5901 Slow transit constipation: Secondary | ICD-10-CM

## 2014-04-13 DIAGNOSIS — L89150 Pressure ulcer of sacral region, unstageable: Secondary | ICD-10-CM

## 2014-04-13 DIAGNOSIS — I69854 Hemiplegia and hemiparesis following other cerebrovascular disease affecting left non-dominant side: Secondary | ICD-10-CM | POA: Diagnosis not present

## 2014-04-13 DIAGNOSIS — E1122 Type 2 diabetes mellitus with diabetic chronic kidney disease: Secondary | ICD-10-CM

## 2014-04-13 DIAGNOSIS — F0150 Vascular dementia without behavioral disturbance: Secondary | ICD-10-CM

## 2014-04-13 DIAGNOSIS — F319 Bipolar disorder, unspecified: Secondary | ICD-10-CM | POA: Diagnosis not present

## 2014-04-13 MED ORDER — OXYCODONE-ACETAMINOPHEN 5-325 MG PO TABS: ORAL_TABLET | ORAL | Status: DC

## 2014-04-13 NOTE — Progress Notes (Signed)
Patient ID: [name], male   DOB: 02/09/1933, 81 y.o.   MRN: [id number]   [facility]  Allergies  Allergen Reactions  . Penicillins Shortness Of Breath    Per MAR. "Throat closes up" per daughter     Code Status: DNR  Goals of Care: Comfort and Quality of Life/Hospice  Chief Complaint  Patient presents with  . Medical Management of Chronic Issues    afib, DM2, old CVA, depression, GERd, chronic pain, dementia    HPI  81 y.o. male  Hospice patient with PMH of HTN, CVA, CHF,  bipolar disorder, chronic atrial fibrillation, PVD, DM2 among others is being seen for a routine visit for management of his chronic issues. Weight continues to decline steadily-lost 4lbs since last visit. No recent fall or skin concerns reported. Unstageable sacral ulcer has resolved. No change in behavior or functional status reported. No concerns from staff. Seen in room today. Denies any concerns. BP well-controlled with range 100-120s/60-70s. CBG range on 89 to 169. GERD stable on PPI. Chronic pain associated with old CVA is well controlled on current regimen. Chronic afib stable despite recent discontinue of coumadin.   Review of Systems Constitutional: Negative for fever and chills  HENT: Negative for congestion, and sore throat Eyes: Negative for eye pain  Cardiovascular: Negative for chest pain  Respiratory: Negative cough and shortness of breath Gastrointestinal: Negative for nausea and vomiting. Negative for abdominal pain. Musculoskeletal: Negative for back pain. Positive for limited ROM of BUE and BLE  Neurological: Negative for dizziness and headache Skin: Negative for rash.   Psychiatric: Negative for depression  Past Medical History  Diagnosis Date  . Hypertension   . CHF (congestive heart failure)     H/o right sided CHF   . Arthritis   . Alzheimer's dementia   . Bipolar 1 disorder   . PVD (peripheral vascular disease)   . DVT (deep venous thrombosis)    . Atrial fibrillation   . CVA (cerebral infarction) 2004    right sided  . Dyslipidemia   . Diabetes mellitus   . Venous stasis ulcers     No past surgical history on file.  History   Social History  . Marital Status: Married    Spouse Name: N/A  . Number of Children: N/A  . Years of Education: N/A   Occupational History  . Not on file.   Social History Main Topics  . Smoking status: Former Games developermoker  . Smokeless tobacco: Not on file  . Alcohol Use: No  . Drug Use: Not on file  . Sexual Activity: Not on file   Other Topics Concern  . Not on file   Social History Narrative      Medication List       This list is accurate as of: 04/13/14  9:06 PM.  Always use your most recent med list.               ALPRAZolam 0.25 MG tablet  Commonly known as:  XANAX  Take 0.25 mg by mouth 2 (two) times daily as needed for anxiety.     aspirin EC 81 MG tablet  Take 81 mg by mouth daily.     carvedilol 3.125 MG tablet  Commonly known as:  COREG  Take 3.125 mg by mouth 2 (two) times daily with a meal.     divalproex 125 MG DR tablet  Commonly known as:  DEPAKOTE  Take 375 mg by  mouth at bedtime.     donepezil 10 MG tablet  Commonly known as:  ARICEPT  Take 10 mg by mouth at bedtime.     DULoxetine 60 MG capsule  Commonly known as:  CYMBALTA  Take 90 mg by mouth daily. For depression     furosemide 20 MG tablet  Commonly known as:  LASIX  Take 20 mg by mouth daily.     insulin detemir 100 UNIT/ML injection  Commonly known as:  LEVEMIR  Inject 16 Units into the skin at bedtime.     iron polysaccharides 150 MG capsule  Commonly known as:  NIFEREX  Take 150 mg by mouth 2 (two) times daily.     morphine 15 MG 12 hr tablet  Commonly known as:  MS CONTIN  Take one tablet by mouth every eight hours for pain s/p CVA with left hemiplegia. Do not crush     omeprazole 20 MG capsule  Commonly known as:  PRILOSEC  Take 40 mg by mouth daily.      oxyCODONE-acetaminophen 5-325 MG per tablet  Commonly known as:  PERCOCET/ROXICET  Take one tablet by mouth three times daily for pain; Take one tablet by mouth every 4 hours as needed for pain     REFRESH TEARS 0.5 % Soln  Generic drug:  carboxymethylcellulose  Place 2 drops into both eyes every 2 (two) hours as needed. FOR DRY EYES     senna-docusate 8.6-50 MG per tablet  Commonly known as:  Senokot-S  Take 1 tablet by mouth 2 (two) times daily.        Physical Exam  BP 128/76 mmHg  Pulse 57  Temp(Src) 97.3 F (36.3 C)  Resp 16  Ht  (1.778 m)  Wt 233 lb 4.8 oz (105.824 kg)  BMI 33.48 kg/m2  SpO2 95%  Constitutional: WDWN elderly male in no acute distress. Conversant and pleasant HEENT: Normocephalic and atraumatic. PERRL. EOM intact. No icterus. Oral mucosa moist.  Neck: No masses or thyromegaly. No JVD or carotid bruits. Cardiac: Irregularly irregular without appreciable murmurs, rubs, or gallops. Distal pulses intact. No dependent edema.  Lungs: No respiratory distress. Breath sounds clear bilaterally without rales, rhonchi, or wheezes. Oxygen via [place] in place at 2 lpm Abdomen: Audible bowel sounds in all quadrants. Soft, nontender, nondistended. Musculoskeletal: Bilateral upper extremities contracture with hand splint/brace in place.  Transferred via hoyer lift, needs assistance with feeding. Limited ROM of all extremities L>R.  Skin: Warm and dry Neurological: Alert and oriented to self Psychiatric: Appropriate mood and affect.   Assessment & Plan 1. Type 2 diabetes mellitus with stage 3 chronic kidney disease Stable. CBG range 80s-160s. Continue levemir 16 units daily at bedtime with daily cbg check. Continue to monitor his status. No more lab draws per family.   2. Gastroesophageal reflux disease, esophagitis presence not specified No issues. Continue omeprazole  daily.  3. Chronic atrial fibrillation Stable. Rate-controlled. Continue coreg 3.125mg  twice  daily. Off of coumadin secondary to recurrent GI bleed. Continue to monitor his status.  4. Hypertensive heart disease with congestive heart failure BP well-controlled. Appears euvolemic on exam. Continue coreg 3.125 twice daily with lasix  daily.   5. Vascular dementia, without behavioral disturbance Stable. Continue aricept  daily. Continue assistance with ADL care. Fall and pressure ulcer precautions  6. Sacral ulcer, unstageable Resolved. Continue pressure ulcer prophylaxis  7. Bipolar 1 disorder Mood stable. Continue cymbalta  daily with depakote  daily at bedtime. Continue xanax 0.25mg  twice  daily as needed for anxiety and monitor for change in mood  8. Chronic respiratory failure, unspecified whether with hypoxia or hypercapnia Stable. Continue continuous oxygen at 2Lpm via [place] and xopenex via neb three times daily as needed for shortness of breath and wheezes.   9. Slow transit constipation Stable. Continue senokot-s twice daily. Encourage hydration and monitor.   10. Hemiparesis affecting left side as late effect of cerebrovascular accident Stable. Continue EC baby asa  daily. Continue ms contin  three times daily and percocet 5/325mg  three times daily with additional percocet 5/325mg  every four hours as needed for pain. Continue to monitor his status   Sports administrator of care discussed resident and nursing staff. Resident and nursing staff verbalized understanding and agree with plan of care. No additional questions or concerns reported.    Loura Back, MSN, AGNP-C [facility] [address] [place], [place] [address] [phone] [8am-5pm] After hours: [phone]

## 2014-04-18 LAB — CBC AND DIFFERENTIAL
HCT: 29 % — AB (ref 41–53)
Hemoglobin: 9.5 g/dL — AB (ref 13.5–17.5)
Platelets: 177 10*3/uL (ref 150–399)
WBC: 2.2 10*3/mL

## 2014-04-18 LAB — BASIC METABOLIC PANEL
BUN: 28 mg/dL — AB (ref 4–21)
Creatinine: 0.9 mg/dL (ref 0.6–1.3)
Glucose: 93 mg/dL
POTASSIUM: 4.7 mmol/L (ref 3.4–5.3)
Sodium: 141 mmol/L (ref 137–147)

## 2014-04-18 LAB — HEPATIC FUNCTION PANEL
ALT: 8 U/L — AB (ref 10–40)
AST: 9 U/L — AB (ref 14–40)
Alkaline Phosphatase: 29 U/L (ref 25–125)

## 2014-05-05 ENCOUNTER — Other Ambulatory Visit: Payer: Self-pay | Admitting: *Deleted

## 2014-05-05 MED ORDER — MORPHINE SULFATE ER 15 MG PO TBCR: EXTENDED_RELEASE_TABLET | ORAL | Status: DC

## 2014-05-05 NOTE — Telephone Encounter (Signed)
[facility] 

## 2014-05-11 ENCOUNTER — Non-Acute Institutional Stay (SKILLED_NURSING_FACILITY): Payer: Medicare Other | Admitting: Registered Nurse

## 2014-05-11 DIAGNOSIS — I482 Chronic atrial fibrillation, unspecified: Secondary | ICD-10-CM

## 2014-05-11 DIAGNOSIS — I110 Hypertensive heart disease with heart failure: Secondary | ICD-10-CM | POA: Diagnosis not present

## 2014-05-11 DIAGNOSIS — K219 Gastro-esophageal reflux disease without esophagitis: Secondary | ICD-10-CM | POA: Diagnosis not present

## 2014-05-11 DIAGNOSIS — I69854 Hemiplegia and hemiparesis following other cerebrovascular disease affecting left non-dominant side: Secondary | ICD-10-CM | POA: Diagnosis not present

## 2014-05-11 DIAGNOSIS — E1122 Type 2 diabetes mellitus with diabetic chronic kidney disease: Secondary | ICD-10-CM | POA: Diagnosis not present

## 2014-05-11 DIAGNOSIS — F0150 Vascular dementia without behavioral disturbance: Secondary | ICD-10-CM | POA: Diagnosis not present

## 2014-05-11 DIAGNOSIS — F319 Bipolar disorder, unspecified: Secondary | ICD-10-CM | POA: Diagnosis not present

## 2014-05-11 DIAGNOSIS — I509 Heart failure, unspecified: Secondary | ICD-10-CM

## 2014-05-11 DIAGNOSIS — I69354 Hemiplegia and hemiparesis following cerebral infarction affecting left non-dominant side: Secondary | ICD-10-CM

## 2014-05-11 DIAGNOSIS — J9610 Chronic respiratory failure, unspecified whether with hypoxia or hypercapnia: Secondary | ICD-10-CM

## 2014-05-11 DIAGNOSIS — N183 Chronic kidney disease, stage 3 (moderate): Secondary | ICD-10-CM

## 2014-05-11 DIAGNOSIS — K5901 Slow transit constipation: Secondary | ICD-10-CM

## 2014-05-12 ENCOUNTER — Encounter: Payer: Self-pay | Admitting: Registered Nurse

## 2014-05-12 NOTE — Progress Notes (Signed)
Patient ID: [name], male   DOB: [birth year], 81 y.o.   MRN: [id number]   Place of Service: [facility]  Allergies  Allergen Reactions  . Penicillins Shortness Of Breath    Per MAR. "Throat closes up" per daughter     Code Status: DNR  Goals of Care: Comfort and Quality of Life/Hospice  Chief Complaint  Patient presents with  . Medical Management of Chronic Issues    old CVA, bipolar, GERD, HTN w/ CHF, chronic respiratory failure, constipation, afib, dementia    HPI  81 y.o. male  Hospice patient with PMH of HTN, CVA, CHF,  bipolar disorder, chronic atrial fibrillation, PVD, DM2 among others is being seen for a routine visit for management of his chronic issues. Continue to have gradual weight loss-2 lbs over the past 30 days. No recent fall or skin concerns reported. No change in behavior or functional status reported. No concerns from staff. CBG range on 99-140s. GERD stable on PPI.  BP well-controlled with range 100-130s/60-70s. Chronic afib stable. No issues with constipation. Mood stable on cymbalta and depakote. Seen in room today. Denies any concerns.   Review of Systems Constitutional: Negative for fever and chills  HENT: Negative for congestion and sore throat Eyes: Negative for eye pain  Cardiovascular: Negative for chest pain  Respiratory: Negative cough and shortness of breath Gastrointestinal: Negative for nausea and vomiting. Negative for abdominal pain. Musculoskeletal: Negative for back pain. Neurological: Negative for dizziness and headache Skin: Negative for rash.   Psychiatric: Negative for depression  Past Medical History  Diagnosis Date  . Hypertension   . CHF (congestive heart failure)     H/o right sided CHF   . Arthritis   . Alzheimer's dementia   . Bipolar 1 disorder   . PVD (peripheral vascular disease)   . DVT (deep venous thrombosis)   . Atrial fibrillation   . CVA (cerebral infarction) 2004    right sided  . Dyslipidemia     . Diabetes mellitus   . Venous stasis ulcers     No past surgical history on file.  History   Social History  . Marital Status: Married    Spouse Name: N/A  . Number of Children: N/A  . Years of Education: N/A   Occupational History  . Not on file.   Social History Main Topics  . Smoking status: Former Games developer  . Smokeless tobacco: Not on file  . Alcohol Use: No  . Drug Use: Not on file  . Sexual Activity: Not on file   Other Topics Concern  . Not on file   Social History Narrative      Medication List       This list is accurate as of: 05/11/14 11:59 PM.  Always use your most recent med list.               ALPRAZolam 0.25 MG tablet  Commonly known as:  XANAX  Take 0.25 mg by mouth 2 (two) times daily as needed for anxiety.     aspirin EC 81 MG tablet  Take 81 mg by mouth daily.     carvedilol 3.125 MG tablet  Commonly known as:  COREG  Take 3.125 mg by mouth 2 (two) times daily with a meal.     divalproex 125 MG DR tablet  Commonly known as:  DEPAKOTE  Take 375 mg by mouth at bedtime.     donepezil 10 MG tablet  Commonly known as:  ARICEPT  Take 10 mg by mouth at bedtime.     DULoxetine 60 MG capsule  Commonly known as:  CYMBALTA  Take 90 mg by mouth daily. For depression     furosemide 20 MG tablet  Commonly known as:  LASIX  Take 20 mg by mouth daily.     insulin detemir 100 UNIT/ML injection  Commonly known as:  LEVEMIR  Inject 16 Units into the skin at bedtime.     iron polysaccharides 150 MG capsule  Commonly known as:  NIFEREX  Take 150 mg by mouth 2 (two) times daily.     morphine 15 MG 12 hr tablet  Commonly known as:  MS CONTIN  Take one tablet by mouth every eight hours for pain s/p CVA with left hemiplegia. Do not crush     omeprazole 20 MG capsule  Commonly known as:  PRILOSEC  Take 40 mg by mouth daily.     oxyCODONE-acetaminophen 5-325 MG per tablet  Commonly known as:  PERCOCET/ROXICET  Take one tablet by mouth three  times daily for pain; Take one tablet by mouth every 4 hours as needed for pain     REFRESH TEARS 0.5 % Soln  Generic drug:  carboxymethylcellulose  Place 2 drops into both eyes every 2 (two) hours as needed. FOR DRY EYES     senna-docusate 8.6-50 MG per tablet  Commonly known as:  Senokot-S  Take 1 tablet by mouth 2 (two) times daily.        Physical Exam  BP 138/73 mmHg  Pulse 70  Temp(Src) 98.6 F (37 C)  Resp 18  Ht 5\' 10"  (1.778 m)  Wt 231 lb 9.6 oz (105.053 kg)  BMI 33.23 kg/m2  SpO2 98%  Constitutional: Thin/Frail elderly male in no acute distress. Conversant and pleasant HEENT: Normocephalic and atraumatic. PERRL. EOM intact. No icterus. Oral mucosa moist.  Neck: No masses or thyromegaly. No JVD or carotid bruits. Cardiac: Irregularly irregular without appreciable murmurs, rubs, or gallops. Distal pulses intact. No dependent edema.  Lungs: No respiratory distress. Breath sounds clear bilaterally without rales, rhonchi, or wheezes. Oxygen via [place] in place at 2 lpm Abdomen: Audible bowel sounds in all quadrants. Soft, nontender, nondistended. Musculoskeletal: Bilateral upper extremities contracture. Bed bound. Limited ROM of all extremities L>R.  Skin: Warm and dry Neurological: Alert and oriented to self Psychiatric: Appropriate mood and affect.   Assessment & Plan 1. Type 2 diabetes mellitus with stage 3 chronic kidney disease Stable. CBG range 99-140s. Continue levemir 16 units daily at bedtime with daily cbg check. Continue to monitor his status.   2. Gastroesophageal reflux disease, esophagitis presence not specified Stable. Continue omeprazole 40mg  daily for now r/t recent GI bleed.   3. Chronic atrial fibrillation Stable. Rate-controlled. Continue coreg 3.125mg  twice daily. Continue to monior  4. Hypertensive heart disease with congestive heart failure BP well-controlled. Clinically compensated. No recent CHF exacerbation. Continue coreg 3.125 twice daily  with lasix 20mg  daily. Continue to monitor  5. Vascular dementia, without behavioral disturbance Stable. Continue aricept 10mg  daily. Continue assist with ADL care. Continue fall and pressure ulcer prophylaxis.   6. Bipolar 1 disorder Mood stable. Continue cymbalta 90mg  daily and depakote 375mg  daily at bedtime with xanax 0.25mg  twice daily as needed for anxiety. Monitor for change in mood  7. Chronic respiratory failure, unspecified whether with hypoxia or hypercapnia No issues. Continue continuous oxygen at 2Lpm via [place] and xopenex via neb three times daily as needed for shortness of  breath and wheezing.    8. Slow transit constipation Stable. Continue senokot-s twice daily. Encourage hydration    9. Hemiparesis affecting left side as late effect of cerebrovascular accident Stable. Continue EC baby asa  daily. Continue ms contin  three times daily and percocet 5/325mg  three times daily with additional percocet 5/325mg  every four hours as needed for pain. Continue to monitor his status   Sports administrator of care discussed resident and nursing staff. Resident and nursing staff verbalized understanding and agree with plan of care. No additional questions or concerns reported.    Loura Back, MSN, AGNP-C [facility] [address] [place], [place] [address] [phone] [8am-5pm] After hours: [phone]

## 2014-06-06 ENCOUNTER — Other Ambulatory Visit: Payer: Self-pay | Admitting: *Deleted

## 2014-06-06 MED ORDER — ALPRAZOLAM 0.25 MG PO TABS: 0.2500 mg | ORAL_TABLET | Freq: Two times a day (BID) | ORAL | Status: AC | PRN

## 2014-06-06 MED ORDER — MORPHINE SULFATE ER 15 MG PO TBCR: EXTENDED_RELEASE_TABLET | ORAL | Status: DC

## 2014-06-06 NOTE — Telephone Encounter (Signed)
[facility] 

## 2014-06-10 ENCOUNTER — Encounter: Payer: Self-pay | Admitting: Registered Nurse

## 2014-06-10 ENCOUNTER — Non-Acute Institutional Stay (SKILLED_NURSING_FACILITY): Payer: Medicare Other | Admitting: Registered Nurse

## 2014-06-10 DIAGNOSIS — I69854 Hemiplegia and hemiparesis following other cerebrovascular disease affecting left non-dominant side: Secondary | ICD-10-CM | POA: Diagnosis not present

## 2014-06-10 DIAGNOSIS — N183 Chronic kidney disease, stage 3 unspecified: Secondary | ICD-10-CM

## 2014-06-10 DIAGNOSIS — E1122 Type 2 diabetes mellitus with diabetic chronic kidney disease: Secondary | ICD-10-CM

## 2014-06-10 DIAGNOSIS — I110 Hypertensive heart disease with heart failure: Secondary | ICD-10-CM | POA: Diagnosis not present

## 2014-06-10 DIAGNOSIS — K219 Gastro-esophageal reflux disease without esophagitis: Secondary | ICD-10-CM | POA: Diagnosis not present

## 2014-06-10 DIAGNOSIS — F0150 Vascular dementia without behavioral disturbance: Secondary | ICD-10-CM

## 2014-06-10 DIAGNOSIS — K5901 Slow transit constipation: Secondary | ICD-10-CM | POA: Diagnosis not present

## 2014-06-10 DIAGNOSIS — J9610 Chronic respiratory failure, unspecified whether with hypoxia or hypercapnia: Secondary | ICD-10-CM | POA: Diagnosis not present

## 2014-06-10 DIAGNOSIS — I509 Heart failure, unspecified: Secondary | ICD-10-CM | POA: Diagnosis not present

## 2014-06-10 DIAGNOSIS — I482 Chronic atrial fibrillation, unspecified: Secondary | ICD-10-CM

## 2014-06-10 DIAGNOSIS — F319 Bipolar disorder, unspecified: Secondary | ICD-10-CM | POA: Diagnosis not present

## 2014-06-10 DIAGNOSIS — I69354 Hemiplegia and hemiparesis following cerebral infarction affecting left non-dominant side: Secondary | ICD-10-CM

## 2014-06-10 NOTE — Progress Notes (Signed)
Patient ID: [name], male   DOB: [birth year], 81 y.o.   MRN: [id number]   Place of Service: [facility]  Allergies  Allergen Reactions  . Penicillins Shortness Of Breath    Per MAR. "Throat closes up" per daughter     Code Status: DNR  Goals of Care: Comfort and Quality of Life/Hospice  Chief Complaint  Patient presents with  . Medical Management of Chronic Issues    old CVA, afib, bipolar, dementia, GERD, DM2 w/ CKD3, HTN, CHF    HPI  81 y.o. male  Hospice patient with PMH of HTN, CVA, CHF,  bipolar disorder, chronic atrial fibrillation, PVD, DM2 among others is being seen for a routine visit for management of his chronic issues. Has 3lbs weight gain over the past 30 days-which is desirable. No recent fall or skin concerns reported. No change in behavior or functional status reported. No concerns from staff.  GERD stable on PPI. CBG range 95-170s. BP well-controlled with range 130-140s/60-70s. Chronic afib stable.  No recent CHF exacerbation. No issues with constipation. Mood stable on cymbalta and depakote. Seen in room today. Denies any concerns.   Review of Systems Constitutional: Negative for fever and chills  HENT: Negative for congestion and sore throat Eyes: Negative for eye pain  Cardiovascular: Negative for chest pain  Respiratory: Negative cough and shortness of breath Gastrointestinal: Negative for nausea and vomiting. Negative for abdominal pain. Musculoskeletal: Negative for back pain. Neurological: Negative for dizziness and headache Skin: Negative for rash.   Psychiatric: Negative for depression  Past Medical History  Diagnosis Date  . Hypertension   . CHF (congestive heart failure)     H/o right sided CHF   . Arthritis   . Alzheimer's dementia   . Bipolar 1 disorder   . PVD (peripheral vascular disease)   . DVT (deep venous thrombosis)   . Atrial fibrillation   . CVA (cerebral infarction) 2004    right sided  . Dyslipidemia   .  Diabetes mellitus   . Venous stasis ulcers     No past surgical history on file.  History   Social History  . Marital Status: Married    Spouse Name: N/A  . Number of Children: N/A  . Years of Education: N/A   Occupational History  . Not on file.   Social History Main Topics  . Smoking status: Former Games developer  . Smokeless tobacco: Not on file  . Alcohol Use: No  . Drug Use: Not on file  . Sexual Activity: Not on file   Other Topics Concern  . Not on file   Social History Narrative      Medication List       This list is accurate as of: 06/10/14  2:06 PM.  Always use your most recent med list.               ALPRAZolam 0.25 MG tablet  Commonly known as:  XANAX  Take 1 tablet (0.25 mg total) by mouth 2 (two) times daily as needed for anxiety.     aspirin EC 81 MG tablet  Take 81 mg by mouth daily.     carvedilol 3.125 MG tablet  Commonly known as:  COREG  Take 3.125 mg by mouth 2 (two) times daily with a meal.     divalproex 125 MG DR tablet  Commonly known as:  DEPAKOTE  Take 375 mg by mouth at bedtime.     donepezil 10 MG tablet  Commonly known as:  ARICEPT  Take 10 mg by mouth at bedtime.     DULoxetine 60 MG capsule  Commonly known as:  CYMBALTA  Take 90 mg by mouth daily. For depression     furosemide 20 MG tablet  Commonly known as:  LASIX  Take 20 mg by mouth daily.     insulin detemir 100 UNIT/ML injection  Commonly known as:  LEVEMIR  Inject 16 Units into the skin at bedtime.     iron polysaccharides 150 MG capsule  Commonly known as:  NIFEREX  Take 150 mg by mouth 2 (two) times daily.     morphine 15 MG 12 hr tablet  Commonly known as:  MS CONTIN  Take one tablet by mouth every eight hours for pain s/p CVA with left hemiplegia. Do not crush     omeprazole 40 MG capsule  Commonly known as:  PRILOSEC  Take 40 mg by mouth daily.     oxyCODONE-acetaminophen 5-325 MG per tablet  Commonly known as:  PERCOCET/ROXICET  Take one tablet  by mouth three times daily for pain; Take one tablet by mouth every 4 hours as needed for pain     senna-docusate 8.6-50 MG per tablet  Commonly known as:  Senokot-S  Take 1 tablet by mouth 2 (two) times daily.     SYSTANE BALANCE OP  Apply 1 drop to eye 4 (four) times daily.        Physical Exam  BP 155/73 mmHg  Pulse 55  Temp(Src) 97 F (36.1 C)  Resp 16  Ht 5\' 10"  (1.778 m)  Wt 234 lb 9.6 oz (106.414 kg)  BMI 33.66 kg/m2  SpO2 96%  Constitutional: Thin/Frail elderly male in no acute distress. Conversant and pleasant HEENT: Normocephalic and atraumatic. PERRL. EOM intact. No icterus. Oral mucosa moist.  Neck: No masses or thyromegaly. No JVD or carotid bruits. Cardiac: Irregularly irregular without appreciable murmurs, rubs, or gallops. Distal pulses intact. No dependent edema.  Lungs: No respiratory distress. Breath sounds clear bilaterally without rales, rhonchi, or wheezes. Oxygen via [place] in place at 2 lpm Abdomen: Audible bowel sounds in all quadrants. Soft, nontender, nondistended. Musculoskeletal: Bilateral upper extremities contracture with hand braces in place. Bed bound. Limited ROM of all extremities L>R.  Skin: Warm and dry Neurological: Alert and oriented to self Psychiatric: Appropriate mood and affect.   Assessment & Plan 1. Type 2 diabetes mellitus with stage 3 chronic kidney disease Stable. CBG range 90-170s. Continue levemir 16 units daily at bedtime. Will change cbg check to weekly instead as daily. Continue monitor his status.   2. Gastroesophageal reflux disease, esophagitis presence not specified No issues. Continue omeprazole 40mg  daily at this time r/t recent GI bleed.   3. Chronic atrial fibrillation Stable. Rate-controlled. Continue coreg 3.125mg  twice daily.   4. Hypertensive heart disease with congestive heart failure BP well-controlled. Appears euvolemic on exam-no recent CHF exacerbation. Continue coreg 3.125 twice daily with lasix 20mg   daily.   5. Vascular dementia, without behavioral disturbance Stable. Decline anticipated. Continue aricept 10mg  daily. Continue assist with ADL care with fall and pressure ulcer precautions.  6. Bipolar 1 disorder Mood stable. Continue cymbalta 90mg  daily with depakote 375mg  daily at bedtime and xanax 0.25mg  twice daily as needed for anxiety. Continue to onitor for change in mood  7. Chronic respiratory failure, unspecified whether with hypoxia or hypercapnia Stable. Denies any respiratory distress. Continue continuous oxygen at 2Lpm via [place]. Monitor clinically  8. Slow transit constipation  Stable. Continue senokot-s twice daily. Encourage adequte hydration    9. Hemiparesis affecting left side as late effect of cerebrovascular accident Stable. Continue EC baby asa  daily. Continue ms contin  three times daily and percocet 5/325mg  three times daily with additional percocet 5/325mg  every four hours as needed for pain.   Family/Staff Communication Plan of care discussed resident and nursing staff. Resident and nursing staff verbalized understanding and agree with plan of care. No additional questions or concerns reported.    Loura Back, MSN, AGNP-C [facility] [address] [place], [place] [address] [phone] [8am-5pm] After hours: [phone]

## 2014-07-05 ENCOUNTER — Other Ambulatory Visit: Payer: Self-pay | Admitting: *Deleted

## 2014-07-05 MED ORDER — MORPHINE SULFATE ER 15 MG PO TBCR: EXTENDED_RELEASE_TABLET | ORAL | Status: DC

## 2014-07-05 NOTE — Telephone Encounter (Signed)
[facility] 

## 2014-07-08 ENCOUNTER — Non-Acute Institutional Stay (SKILLED_NURSING_FACILITY): Payer: Medicare Other | Admitting: Internal Medicine

## 2014-07-08 DIAGNOSIS — F0150 Vascular dementia without behavioral disturbance: Secondary | ICD-10-CM

## 2014-07-08 DIAGNOSIS — D509 Iron deficiency anemia, unspecified: Secondary | ICD-10-CM | POA: Diagnosis not present

## 2014-07-08 DIAGNOSIS — K219 Gastro-esophageal reflux disease without esophagitis: Secondary | ICD-10-CM | POA: Diagnosis not present

## 2014-07-08 DIAGNOSIS — I509 Heart failure, unspecified: Secondary | ICD-10-CM | POA: Diagnosis not present

## 2014-07-08 DIAGNOSIS — I110 Hypertensive heart disease with heart failure: Secondary | ICD-10-CM

## 2014-07-08 DIAGNOSIS — H04129 Dry eye syndrome of unspecified lacrimal gland: Secondary | ICD-10-CM | POA: Insufficient documentation

## 2014-07-08 DIAGNOSIS — I69391 Dysphagia following cerebral infarction: Secondary | ICD-10-CM | POA: Insufficient documentation

## 2014-07-08 DIAGNOSIS — E1122 Type 2 diabetes mellitus with diabetic chronic kidney disease: Secondary | ICD-10-CM

## 2014-07-08 DIAGNOSIS — N189 Chronic kidney disease, unspecified: Secondary | ICD-10-CM | POA: Diagnosis not present

## 2014-07-08 DIAGNOSIS — H04123 Dry eye syndrome of bilateral lacrimal glands: Secondary | ICD-10-CM | POA: Diagnosis not present

## 2014-07-08 DIAGNOSIS — I69359 Hemiplegia and hemiparesis following cerebral infarction affecting unspecified side: Secondary | ICD-10-CM | POA: Diagnosis not present

## 2014-07-08 NOTE — Progress Notes (Signed)
Patient ID: [name], male   DOB: 12/19/1932, [age] y.o.   MRN: [id number]      [facility]  Code Status: DNR  Chief complaint- routine visit  Allergies reviewed  HPI:   81 y/o male patient is seen for routine visit. He was under hospice care until earlier this month. He has history of ckd, dm, HTN, OSA, CHF, cva and afib on coumadin among others. He is mainly in bed per patient's choice. He is alert and oriented and in no distress this am. Continues to be on oxygen. Denies any concerns today. No concern from staff. His sacral ulcer has resolved. No falls reported. No new behavioral concerns. Appetite is fair.   Review of Systems:  Constitutional: Negative for fever, chills, diaphoresis.  HENT: Negative for congestion and sore throat.   Eyes: Negative for blurred vision, double vision and discharge.  Respiratory: Negative for shortness of breath and wheezing. On chronic oxygen  Cardiovascular: Negative for chest pain, palpitations, leg swelling.  Gastrointestinal: Negative for heartburn, nausea, vomiting, abdominal pain, diarrhea and constipation.  Genitourinary: Negative for flank pain.  Musculoskeletal: Negative for falls. Chronic pain under control Skin: Negative for itching and rash.  Neurological: Negative for dizziness, headaches.  Psychiatric/Behavioral: Negative for depression. Denies insomnia  Past Medical History  Diagnosis Date  . Hypertension   . CHF (congestive heart failure)     H/o right sided CHF   . Arthritis   . Alzheimer's dementia   . Bipolar 1 disorder   . PVD (peripheral vascular disease)   . DVT (deep venous thrombosis)   . Atrial fibrillation   . CVA (cerebral infarction) 2004    right sided  . Dyslipidemia   . Diabetes mellitus   . Venous stasis ulcers    [facility]  Physical exam BP 126/88 mmHg  Pulse 72  Temp(Src) 98.5 F (36.9 C)  Resp 18  Ht 5\' 10"  (1.778 m)  Wt 230 lb 6.4 oz (104.509 kg)   BMI 33.06 kg/m2  Wt Readings from Last 3 Encounters:  07/08/14 230 lb 6.4 oz (104.509 kg)  06/10/14 234 lb 9.6 oz (106.414 kg)  05/11/14 231 lb 9.6 oz (105.053 kg)   General- elderly obese male in no acute distress Head- atraumatic, normocephalic Eyes- PERRLA, EOMI, no pallor, no icterus, no discharge Neck- no lymphadenopathy, no thyromegaly, no jugular vein distension, no carotid bruit Nose- no maxillary sinus tenderness, no frontal sinus tenderness Mouth- normal mucus membrane Cardiovascular- irregular heart rate, diminished dorsalis pedis pulses, no leg edema Respiratory- bilateral poor air entry, no wheeze, no rhonchi, no crackles, on o2 by nasal canula Abdomen- bowel sounds present, soft, non tender, no guarding or rigidity Musculoskeletal- bilateral upper extremities contracture at elbows and hands, has hand braces, transferred via hoyer lift, needs assistance with feeding, ROM limited in UE and LE Neurological- alert and oriented to person and place Skin- warm and dry Psychiatry- normal mood and affect   Labs CBC Latest Ref Rng 04/18/2014 02/03/2011 02/02/2011  WBC - 2.2 7.1 8.5  Hemoglobin 13.5 - 17.5 g/dL 9.5(A) 12.6(L) 12.9(L)  Hematocrit 41 - 53 % 29(A) 39.5 39.1  Platelets 150 - 399 K/L 177 166 189   CMP Latest Ref Rng 04/18/2014 02/03/2011 02/02/2011  Glucose 70 - 99 mg/dL - 425(Z143(H) 563(O175(H)  BUN 4 - 21 mg/dL 75(I28(A) 23 18  Creatinine 0.6 - 1.3 mg/dL 0.9 4.331.19 2.951.00  Sodium 188137 - 147 mmol/L 141 140 137  Potassium 3.4 -  5.3 mmol/L 4.7 4.1 4.6  Chloride 96 - 112 mEq/L - 98 99  CO2 19 - 32 mEq/L - 34(H) 31  Calcium 8.4 - 10.5 mg/dL - 9.2 8.9  Total Protein - - - -  Total Bilirubin - - - -  Alkaline Phos 25 - 125 U/L 29 - -  AST 14 - 40 U/L 9(A) - -  ALT 10 - 40 U/L 8(A) - -     Assessment/plan  Dm type 2 with renal manifestations Continue levemir 16 u daily, monitor cbg daily. Check a1c and cmp.   Old cva with hemiparesis Continue aspirin ec 81 mg daily. Continue  ms contin 15 mg tid with percocet 5-325 mg tid and prn for breakthrough pain. Continue cymbalta 90 mg daily for muscle spasm  Dysphagia post cva Aspiration precautions, continue puree feed and thin liquids, spiiy cup, no straws.  gerd Stable, continue omeprazole 40 mg daily. Has hx of gi bleed  afib Rate controlled. Continue coreg 3.125 mg bid  chf Stable, continue lasix 20 mg daily, coreg 3.125 mg bid  Slow transit constipation Continue senna s bid and miralax  Iron def anemia Continue ferrex 150 mg bid and check cbc  Tear film insufficiency Continue artificial tears  Vascular dementia Post cva, continue aripcet 10 mg daily with depakote and xanax for mood  Labs- cbc, cmp, a1c  Oneal GroutMAHIMA Luccas Towell, MD  [facility] [phone] (Monday-Friday 8 am - 5 pm) [phone] (afterhours)

## 2014-07-11 LAB — HEPATIC FUNCTION PANEL
ALK PHOS: 31 U/L (ref 25–125)
AST: 9 U/L — AB (ref 14–40)
Bilirubin, Total: 0.3 mg/dL

## 2014-07-11 LAB — HEMOGLOBIN A1C: Hgb A1c MFr Bld: 6.5 % — AB (ref 4.0–6.0)

## 2014-07-14 ENCOUNTER — Other Ambulatory Visit: Payer: Self-pay | Admitting: *Deleted

## 2014-07-14 MED ORDER — OXYCODONE-ACETAMINOPHEN 5-325 MG PO TABS: ORAL_TABLET | ORAL | Status: DC

## 2014-07-14 NOTE — Telephone Encounter (Signed)
[facility] 

## 2014-07-29 ENCOUNTER — Other Ambulatory Visit: Payer: Self-pay

## 2014-07-29 MED ORDER — MORPHINE SULFATE ER 15 MG PO TBCR: EXTENDED_RELEASE_TABLET | ORAL | Status: DC

## 2014-07-29 NOTE — Telephone Encounter (Signed)
[facility] 

## 2014-08-08 LAB — BASIC METABOLIC PANEL
BUN: 28 mg/dL — AB (ref 4–21)
CREATININE: 0.9 mg/dL (ref 0.6–1.3)
Glucose: 101 mg/dL
POTASSIUM: 4.6 mmol/L (ref 3.4–5.3)
Sodium: 142 mmol/L (ref 137–147)

## 2014-08-08 LAB — CBC AND DIFFERENTIAL
HCT: 28 % — AB (ref 41–53)
Hemoglobin: 9.3 g/dL — AB (ref 13.5–17.5)
Platelets: 155 10*3/uL (ref 150–399)
WBC: 6.7 10*3/mL

## 2014-08-15 ENCOUNTER — Non-Acute Institutional Stay (SKILLED_NURSING_FACILITY): Payer: Medicare Other | Admitting: Internal Medicine

## 2014-08-15 DIAGNOSIS — F0150 Vascular dementia without behavioral disturbance: Secondary | ICD-10-CM | POA: Diagnosis not present

## 2014-08-15 DIAGNOSIS — G8104 Flaccid hemiplegia affecting left nondominant side: Secondary | ICD-10-CM | POA: Diagnosis not present

## 2014-08-15 DIAGNOSIS — E1151 Type 2 diabetes mellitus with diabetic peripheral angiopathy without gangrene: Secondary | ICD-10-CM

## 2014-08-15 NOTE — Progress Notes (Signed)
Patient ID: [name], male   DOB: 09/17/1932, 81 y.o.   MRN: [id number]     [facility]  Code Status: DNR  Chief complaint- routine visit  Allergies reviewed  HPI:   81 y/o male patient is seen for routine visit. He has history of ckd, dm, HTN, OSA, CHF, cva with hemiplegia, vascular dementia and afib on coumadin among others. He is mainly in bed per patient's choice. He is alert and in no distress this am. Continues to be on oxygen. He is compliant with his medications. There were concerns for uti per daughter and u/a was done, normal. Weight has been stable. Sugar is well controlled. No acute behavioral changes.he needs feeding assistance. Denies any concerns today. No concern from staff.   Review of Systems:  Constitutional: Negative for fever, chills, diaphoresis.  HENT: Negative for congestion and sore throat.   Eyes: Negative for blurred vision, double vision and discharge.  Respiratory: Negative for shortness of breath and wheezing. On chronic oxygen  Cardiovascular: Negative for chest pain, palpitations, leg swelling.  Gastrointestinal: Negative for heartburn, nausea, vomiting, abdominal pain, diarrhea and constipation.  Genitourinary: Negative for flank pain.  Musculoskeletal: Negative for falls. Chronic pain under control Skin: Negative for itching and rash.  Neurological: Negative for dizziness, headaches.  Psychiatric/Behavioral: Negative for depression. Denies insomnia  Past Medical History  Diagnosis Date  . Hypertension   . CHF (congestive heart failure)     H/o right sided CHF   . Arthritis   . Alzheimer's dementia   . Bipolar 1 disorder   . PVD (peripheral vascular disease)   . DVT (deep venous thrombosis)   . Atrial fibrillation   . CVA (cerebral infarction) 2004    right sided  . Dyslipidemia   . Diabetes mellitus   . Venous stasis ulcers    [facility]   Medication List       This list is accurate as  of: 08/15/14 11:48 AM.  Always use your most recent med list.               ALPRAZolam 0.25 MG tablet  Commonly known as:  XANAX  Take 1 tablet (0.25 mg total) by mouth 2 (two) times daily as needed for anxiety.     aspirin EC 81 MG tablet  Take 81 mg by mouth daily.     carvedilol 3.125 MG tablet  Commonly known as:  COREG  Take 3.125 mg by mouth 2 (two) times daily with a meal.     divalproex 125 MG DR tablet  Commonly known as:  DEPAKOTE  Take 375 mg by mouth at bedtime.     donepezil 10 MG tablet  Commonly known as:  ARICEPT  Take 10 mg by mouth at bedtime.     DULoxetine 60 MG capsule  Commonly known as:  CYMBALTA  Take 60 mg by mouth daily. For depression     furosemide 20 MG tablet  Commonly known as:  LASIX  Take 20 mg by mouth daily.     insulin detemir 100 UNIT/ML injection  Commonly known as:  LEVEMIR  Inject 12 Units into the skin at bedtime.     iron polysaccharides 150 MG capsule  Commonly known as:  NIFEREX  Take 150 mg by mouth 2 (two) times daily.     mirtazapine 7.5 MG tablet  Commonly known as:  REMERON  Take 7.5 mg by mouth at bedtime.     morphine  15 MG 12 hr tablet  Commonly known as:  MS CONTIN  Take one tablet by mouth every eight hours for pain s/p CVA with left hemiplegia. Do not crush     omeprazole 40 MG capsule  Commonly known as:  PRILOSEC  Take 40 mg by mouth daily.     oxyCODONE-acetaminophen 5-325 MG per tablet  Commonly known as:  PERCOCET/ROXICET  Take one tablet by mouth three times daily for pain; Take one tablet by mouth every 4 hours as needed for pain. Do not exceed 4gm of APAP in 24 hours     senna-docusate 8.6-50 MG per tablet  Commonly known as:  Senokot-S  Take 1 tablet by mouth 2 (two) times daily.     SYSTANE BALANCE OP  Apply 1 drop to eye 4 (four) times daily.        Physical exam BP 133/67 mmHg  Pulse 72  Temp(Src) 97.8 F (36.6 C)  Resp 16  Ht 5\' 10"  (1.778 m)  Wt 231 lb 3.2 oz (104.872 kg)   BMI 33.17 kg/m2  SpO2 95%  Wt Readings from Last 3 Encounters:  08/15/14 231 lb 3.2 oz (104.872 kg)  07/08/14 230 lb 6.4 oz (104.509 kg)  06/10/14 234 lb 9.6 oz (106.414 kg)   General- elderly obese male in no acute distress Head- atraumatic, normocephalic Eyes- PERRLA, EOMI, no pallor, no icterus, no discharge Neck- no lymphadenopathy, no thyromegaly, no jugular vein distension Nose- no maxillary sinus tenderness, no frontal sinus tenderness Mouth- normal mucus membrane Cardiovascular- irregular heart rate, diminished dorsalis pedis pulses, no leg edema Respiratory- bilateral poor air entry, no wheeze, no rhonchi, no crackles, on o2 by nasal canula Abdomen- bowel sounds present, soft, non tender, no guarding or rigidity Musculoskeletal- bilateral upper extremities contracture at elbows and hands, has hand braces, transferred via hoyer lift, needs assistance with feeding, ROM limited in UE and LE Neurological- alert and oriented to person and place Skin- warm and dry Psychiatry- normal mood and affect   Labs CBC Latest Ref Rng 04/18/2014 02/03/2011 02/02/2011  WBC - 2.2 7.1 8.5  Hemoglobin 13.5 - 17.5 g/dL 9.5(A) 12.6(L) 12.9(L)  Hematocrit 41 - 53 % 29(A) 39.5 39.1  Platelets 150 - 399 K/L 177 166 189   CMP Latest Ref Rng 04/18/2014 02/03/2011 02/02/2011  Glucose 70 - 99 mg/dL - 409(W) 119(J)  BUN 4 - 21 mg/dL 47(W) 23 18  Creatinine 0.6 - 1.3 mg/dL 0.9 2.95 6.21  Sodium 308 - 147 mmol/L 141 140 137  Potassium 3.4 - 5.3 mmol/L 4.7 4.1 4.6  Chloride 96 - 112 mEq/L - 98 99  CO2 19 - 32 mEq/L - 34(H) 31  Calcium 8.4 - 10.5 mg/dL - 9.2 8.9  Total Protein - - - -  Total Bilirubin - - - -  Alkaline Phos 25 - 125 U/L 29 - -  AST 14 - 40 U/L 9(A) - -  ALT 10 - 40 U/L 8(A) - -   07/11/14 wbc 5.9, hb 10, hct 30.1, plt 144, na 136, k 5.1, bun 29, cr 1.01, glu 121, ca 8.6, a1c 6.5   Assessment/plan  Flaccid hemiplegia affecting non dominant side Tolerating decreased dose of  cymbalta well, continue 60 mg daily with percocet and morphine  Type 2 dm with peripheral angiopathy Continue levemir 12 u daily, a1c 6.5. Monitor cbg. Continue skin care and monitor for wounds  Vascular dementia without behavioral disturbance Stable, continue aricept 10 mg daily, decline anticipated. Assistance with ADLs  Oneal Grout, MD  [facility] [phone] (Monday-Friday 8 am - 5 pm) [phone] (afterhours)

## 2014-09-05 ENCOUNTER — Other Ambulatory Visit: Payer: Self-pay | Admitting: *Deleted

## 2014-09-05 MED ORDER — MORPHINE SULFATE ER 15 MG PO TBCR: EXTENDED_RELEASE_TABLET | ORAL | Status: DC

## 2014-09-05 NOTE — Telephone Encounter (Signed)
[facility] 

## 2014-09-15 ENCOUNTER — Non-Acute Institutional Stay (SKILLED_NURSING_FACILITY): Payer: Medicare Other | Admitting: Internal Medicine

## 2014-09-15 ENCOUNTER — Encounter: Payer: Self-pay | Admitting: Internal Medicine

## 2014-09-15 DIAGNOSIS — D509 Iron deficiency anemia, unspecified: Secondary | ICD-10-CM

## 2014-09-15 DIAGNOSIS — R532 Functional quadriplegia: Secondary | ICD-10-CM | POA: Diagnosis not present

## 2014-09-15 DIAGNOSIS — K219 Gastro-esophageal reflux disease without esophagitis: Secondary | ICD-10-CM

## 2014-09-15 DIAGNOSIS — F0150 Vascular dementia without behavioral disturbance: Secondary | ICD-10-CM | POA: Diagnosis not present

## 2014-09-15 NOTE — Progress Notes (Signed)
Patient ID: [name], male   DOB: [birth year], 81 y.o.   MRN: [id number]     [name]  Code Status: DNR  Chief complaint- routine visit  Allergies reviewed  HPI:   81 y/o male patient is seen for routine visit. He is in his bed, in no distress and denies any concerns. He is on o2, breathing stbale. Reflux symptoms controlled. Weight is stable. He has history of ckd, dm, HTN, OSA, CHF, cva with hemiplegia, vascular dementia and afib on coumadin among others.   Review of Systems:  Constitutional: Negative for fever, chills, diaphoresis.  HENT: Negative for congestion and sore throat.   Eyes: Negative for blurred vision, double vision and discharge.  Respiratory: Negative for shortness of breath and wheezing. On chronic oxygen  Cardiovascular: Negative for chest pain, palpitations, leg swelling.  Gastrointestinal: Negative for heartburn, nausea, vomiting, abdominal pain Genitourinary: Negative for flank pain.  Musculoskeletal: Negative for falls. Chronic pain under control Skin: Negative for itching and rash.  Neurological: Negative for dizziness, headaches.  Psychiatric/Behavioral: Negative for depression. Denies insomnia  Past Medical History  Diagnosis Date  . Hypertension   . CHF (congestive heart failure)     H/o right sided CHF   . Arthritis   . Alzheimer's dementia   . Bipolar 1 disorder   . PVD (peripheral vascular disease)   . DVT (deep venous thrombosis)   . Atrial fibrillation   . CVA (cerebral infarction) 2004    right sided  . Dyslipidemia   . Diabetes mellitus   . Venous stasis ulcers    Medication reviewed. See [facility]   Medication List       This list is accurate as of: 09/15/14  4:45 PM.  Always use your most recent med list.               ALPRAZolam 0.25 MG tablet  Commonly known as:  XANAX  Take 1 tablet (0.25 mg total) by mouth 2 (two) times daily as needed for anxiety.     aspirin EC 81 MG tablet  Take 81 mg by  mouth daily.     carvedilol 3.125 MG tablet  Commonly known as:  COREG  Take 3.125 mg by mouth 2 (two) times daily with a meal.     divalproex 125 MG DR tablet  Commonly known as:  DEPAKOTE  Take 375 mg by mouth at bedtime.     donepezil 10 MG tablet  Commonly known as:  ARICEPT  Take 10 mg by mouth at bedtime.     DULoxetine 60 MG capsule  Commonly known as:  CYMBALTA  Take 60 mg by mouth daily. For depression     furosemide 20 MG tablet  Commonly known as:  LASIX  Take 20 mg by mouth daily.     insulin detemir 100 UNIT/ML injection  Commonly known as:  LEVEMIR  Inject 12 Units into the skin at bedtime.     iron polysaccharides 150 MG capsule  Commonly known as:  NIFEREX  Take 150 mg by mouth 2 (two) times daily.     MIRALAX powder  Generic drug:  polyethylene glycol powder  Take 1 Container by mouth once. Mix with 4-8 ounces of liquid daily for constipation     mirtazapine 7.5 MG tablet  Commonly known as:  REMERON  Take 7.5 mg by mouth at bedtime.     morphine 15 MG 12 hr tablet  Commonly known as:  MS CONTIN  Take one tablet by mouth every eight hours for pain s/p CVA with left hemiplegia. Do not crush     OCUSOFT LID SCRUB Pads  Apply topically. Use as directed to each eye every morning for Chronic Blepharitis     omeprazole 40 MG capsule  Commonly known as:  PRILOSEC  Take 40 mg by mouth daily.     oxyCODONE-acetaminophen 5-325 MG per tablet  Commonly known as:  PERCOCET/ROXICET  Take one tablet by mouth three times daily for pain; Take one tablet by mouth every 4 hours as needed for pain. Do not exceed 4gm of APAP in 24 hours     senna-docusate 8.6-50 MG per tablet  Commonly known as:  Senokot-S  Take 1 tablet by mouth 2 (two) times daily.     SYSTANE BALANCE OP  Apply 1 drop to eye 4 (four) times daily.        Physical exam BP 128/85 mmHg  Pulse 72  Temp(Src) 99.2 F (37.3 C) (Oral)  Resp 17  Ht  (1.778 m)  Wt 233 lb 8 oz (105.915  kg)  BMI 33.50 kg/m2  SpO2 96%  Wt Readings from Last 3 Encounters:  09/15/14 233 lb 8 oz (105.915 kg)  08/15/14 231 lb 3.2 oz (104.872 kg)  07/08/14 230 lb 6.4 oz (104.509 kg)   General- elderly obese male in no acute distress Head- atraumatic, normocephalic Eyes- PERRLA, EOMI, no pallor, no icterus, no discharge Neck- no lymphadenopathy, no thyromegaly, no jugular vein distension Nose- no maxillary sinus tenderness, no frontal sinus tenderness Mouth- normal mucus membrane Cardiovascular- irregular heart rate, diminished dorsalis pedis pulses, no leg edema Respiratory- bilateral poor air entry, no wheeze, no rhonchi, no crackles, on o2 by nasal canula Abdomen- bowel sounds present, soft, non tender, no guarding or rigidity Musculoskeletal- bilateral upper extremities contracture at elbows and hands, has hand braces, transferred via hoyer lift, needs assistance with feeding, ROM limited in UE and LE Neurological- alert and oriented to person and place Skin- warm and dry Psychiatry- normal mood, poor insight   Labs CBC Latest Ref Rng 08/08/2014 04/18/2014 02/03/2011  WBC - 6.7 2.2 7.1  Hemoglobin 13.5 - 17.5 g/dL 9.3(A) 9.5(A) 12.6(L)  Hematocrit 41 - 53 % 28(A) 29(A) 39.5  Platelets 150 - 399 K/L 155 177 166   CMP Latest Ref Rng 08/08/2014 07/11/2014 04/18/2014  Glucose 70 - 99 mg/dL - - -  BUN 4 - 21 mg/dL 16(X) - 09(U)  Creatinine 0.6 - 1.3 mg/dL 0.9 - 0.9  Sodium 045 - 147 mmol/L 142 - 141  Potassium 3.4 - 5.3 mmol/L 4.6 - 4.7  Chloride 96 - 112 mEq/L - - -  CO2 19 - 32 mEq/L - - -  Calcium 8.4 - 10.5 mg/dL - - -  Total Protein - - - -  Total Bilirubin - - - -  Alkaline Phos 25 - 125 U/L - 31 29  AST 14 - 40 U/L - 9(A) 9(A)  ALT 10 - 40 U/L - - 8(A)   07/11/14 wbc 5.9, hb 10, hct 30.1, plt 144, na 136, k 5.1, bun 29, cr 1.01, glu 121, ca 8.6, a1c 6.5   Assessment/plan  Vascular dementia Stable, no behavioral disturbance, continue aricpet 10 mg daily  Functional  quadriplegia Under total care. Pressure ulcer prophylaxis and fall precautions. continue cymbalta 60 mg daily with percocet and morphine  Iron def anemia Hb 9.3, continue feerex and PPI for now.  gerd Stable, continue prilosec 40 mg  daily for now and reassess  Peter Grout, MD  [facility] [phone] (Monday-Friday 8 am - 5 pm) [phone] (afterhours)

## 2014-10-06 ENCOUNTER — Other Ambulatory Visit: Payer: Self-pay | Admitting: *Deleted

## 2014-10-06 MED ORDER — MORPHINE SULFATE ER 15 MG PO TBCR: EXTENDED_RELEASE_TABLET | ORAL | Status: DC

## 2014-10-06 NOTE — Telephone Encounter (Signed)
[facility] 

## 2014-10-19 ENCOUNTER — Encounter: Payer: Self-pay | Admitting: Internal Medicine

## 2014-10-19 ENCOUNTER — Non-Acute Institutional Stay (SKILLED_NURSING_FACILITY): Payer: Medicare Other | Admitting: Internal Medicine

## 2014-10-19 DIAGNOSIS — G4733 Obstructive sleep apnea (adult) (pediatric): Secondary | ICD-10-CM | POA: Diagnosis not present

## 2014-10-19 DIAGNOSIS — F0150 Vascular dementia without behavioral disturbance: Secondary | ICD-10-CM

## 2014-10-19 DIAGNOSIS — G8104 Flaccid hemiplegia affecting left nondominant side: Secondary | ICD-10-CM

## 2014-10-19 DIAGNOSIS — I480 Paroxysmal atrial fibrillation: Secondary | ICD-10-CM | POA: Diagnosis not present

## 2014-10-19 DIAGNOSIS — G4700 Insomnia, unspecified: Secondary | ICD-10-CM

## 2014-10-19 DIAGNOSIS — J9611 Chronic respiratory failure with hypoxia: Secondary | ICD-10-CM

## 2014-10-19 NOTE — Progress Notes (Signed)
Patient ID: [name], male   DOB: 06-01-1932, 81 y.o.   MRN: [id number]      [facility]  Code Status: DNR  Chief complaint- routine visit  Allergies reviewed  HPI:   81 y/o male patient is seen for routine visit. He is in his bed, in no distress and denies any concerns. He is on o2. Sleepy this am. Has been gaining weight and is currently on mechanical soft diet. He is now off remeron. He is currently on trazodone recently started to help him sleep at night. He is under total care. Reflux symptoms controlled. He has history of ckd, dm, HTN, OSA, CHF, cva with hemiplegia, vascular dementia and afib on coumadin among others.   Review of Systems:  Constitutional: Negative for fever, chills, diaphoresis.  HENT: Negative for congestion and sore throat.   Eyes: Negative for blurred vision, double vision and discharge.  Respiratory: Negative for shortness of breath and wheezing. On chronic oxygen  Cardiovascular: Negative for chest pain, palpitations, leg swelling.  Gastrointestinal: Negative for heartburn, nausea, vomiting, abdominal pain Genitourinary: Negative for flank pain.  Musculoskeletal: Negative for falls. Chronic pain under control Skin: Negative for itching and rash.  Neurological: Negative for dizziness, headaches.  Psychiatric/Behavioral: insomnia present  Past Medical History  Diagnosis Date  . Hypertension   . CHF (congestive heart failure)     H/o right sided CHF   . Arthritis   . Alzheimer's dementia   . Bipolar 1 disorder   . PVD (peripheral vascular disease)   . DVT (deep venous thrombosis)   . Atrial fibrillation   . CVA (cerebral infarction) 2004    right sided  . Dyslipidemia   . Diabetes mellitus   . Venous stasis ulcers    Medication reviewed. See [facility]   Medication List       This list is accurate as of: 10/19/14 12:08 PM.  Always use your most recent med list.               ALPRAZolam 0.25 MG tablet  Commonly  known as:  XANAX  Take 1 tablet (0.25 mg total) by mouth 2 (two) times daily as needed for anxiety.     aspirin EC 81 MG tablet  Take 81 mg by mouth daily.     carvedilol 3.125 MG tablet  Commonly known as:  COREG  Take 3.125 mg by mouth 2 (two) times daily with a meal.     divalproex 125 MG DR tablet  Commonly known as:  DEPAKOTE  Take 375 mg by mouth at bedtime.     donepezil 10 MG tablet  Commonly known as:  ARICEPT  Take 10 mg by mouth at bedtime.     DULoxetine 60 MG capsule  Commonly known as:  CYMBALTA  Take 60 mg by mouth daily. For depression     furosemide 20 MG tablet  Commonly known as:  LASIX  Take 20 mg by mouth daily.     insulin detemir 100 UNIT/ML injection  Commonly known as:  LEVEMIR  Inject 12 Units into the skin at bedtime.     iron polysaccharides 150 MG capsule  Commonly known as:  NIFEREX  Take 150 mg by mouth 2 (two) times daily.     MIRALAX powder  Generic drug:  polyethylene glycol powder  Take 1 Container by mouth once. Mix with 4-8 ounces of liquid daily for constipation     morphine 15 MG 12 hr tablet  Commonly known as:  MS CONTIN  Take one tablet by mouth every eight hours for pain s/p CVA with left hemiplegia. Do not crush     OCUSOFT LID SCRUB Pads  Apply topically. Use as directed to each eye every morning for Chronic Blepharitis     omeprazole 40 MG capsule  Commonly known as:  PRILOSEC  Take 40 mg by mouth daily.     oxyCODONE-acetaminophen 5-325 MG per tablet  Commonly known as:  PERCOCET/ROXICET  Take one tablet by mouth three times daily for pain; Take one tablet by mouth every 4 hours as needed for pain. Do not exceed 4gm of APAP in 24 hours     senna-docusate 8.6-50 MG per tablet  Commonly known as:  Senokot-S  Take 1 tablet by mouth 2 (two) times daily.     SYSTANE BALANCE OP  Apply 1 drop to eye 4 (four) times daily.     TRAZODONE HCL PO  Take 12.5 mg by mouth at bedtime as needed. For insomnia         Physical exam BP 141/62 mmHg  Pulse 66  Temp(Src) 97.5 F (36.4 C) (Oral)  Resp 21  Ht 5\' 10"  (1.778 m)  Wt 246 lb (111.585 kg)  BMI 35.30 kg/m2  SpO2 97%  Wt Readings from Last 3 Encounters:  10/19/14 246 lb (111.585 kg)  09/15/14 233 lb 8 oz (105.915 kg)  08/15/14 231 lb 3.2 oz (104.872 kg)   General- elderly obese male in no acute distress Head- atraumatic, normocephalic Eyes- PERRLA, EOMI, no pallor, no icterus, no discharge Neck- no lymphadenopathy, no thyromegaly, no jugular vein distension Nose- no maxillary sinus tenderness, no frontal sinus tenderness Mouth- normal mucus membrane Cardiovascular- irregular heart rate, diminished dorsalis pedis pulses, 1+ leg edema Respiratory- bilateral poor air entry, no wheeze, no rhonchi, no crackles, on o2 by nasal canula Abdomen- bowel sounds present, soft, non tender, no guarding or rigidity Musculoskeletal- bilateral upper extremities contracture at elbows and hands, has hand braces, transferred via hoyer lift, needs assistance with feeding, ROM limited in UE and LE Neurological- alert and oriented to person Skin- warm and dry Psychiatry- normal mood, poor insight   Labs CBC Latest Ref Rng 08/08/2014 04/18/2014 02/03/2011  WBC - 6.7 2.2 7.1  Hemoglobin 13.5 - 17.5 g/dL 9.3(A) 9.5(A) 12.6(L)  Hematocrit 41 - 53 % 28(A) 29(A) 39.5  Platelets 150 - 399 K/L 155 177 166   CMP Latest Ref Rng 08/08/2014 07/11/2014 04/18/2014  Glucose 70 - 99 mg/dL - - -  BUN 4 - 21 mg/dL 78(G) - 95(A)  Creatinine 0.6 - 1.3 mg/dL 0.9 - 0.9  Sodium 213 - 147 mmol/L 142 - 141  Potassium 3.4 - 5.3 mmol/L 4.6 - 4.7  Chloride 96 - 112 mEq/L - - -  CO2 19 - 32 mEq/L - - -  Calcium 8.4 - 10.5 mg/dL - - -  Total Protein - - - -  Total Bilirubin - - - -  Alkaline Phos 25 - 125 U/L - 31 29  AST 14 - 40 U/L - 9(A) 9(A)  ALT 10 - 40 U/L - - 8(A)    Assessment/plan  Morbid obesity from excess calories Off renal with BMI 35.35. Calorie controlled  diet with small portion. Stop feeding supplement. Monitor weight  Chronic respiratory failure Continue o2 to keep o2 sat >90  Insomnia On trazodone 12.5 mg qhs prn for now, monitor  OSA Continue o2 with goal o2 sat > 90%  afib  Rate controlled. Continue coreg 3.125 mg bid with aspirin 81 mg daily  Vascular dementia Stable, no behavioral disturbance, continue aricpet 10 mg daily  Functional quadriplegia Under total care. Pressure ulcer prophylaxis and fall precautions. continue cymbalta 60 mg daily with percocet and morphine    Oneal Grout, MD  [facility] [phone] (Monday-Friday 8 am - 5 pm) [phone] (afterhours)

## 2014-11-14 ENCOUNTER — Encounter: Payer: Self-pay | Admitting: Internal Medicine

## 2014-11-14 ENCOUNTER — Non-Acute Institutional Stay (SKILLED_NURSING_FACILITY): Payer: Medicare Other | Admitting: Internal Medicine

## 2014-11-14 DIAGNOSIS — E1151 Type 2 diabetes mellitus with diabetic peripheral angiopathy without gangrene: Secondary | ICD-10-CM

## 2014-11-14 DIAGNOSIS — D509 Iron deficiency anemia, unspecified: Secondary | ICD-10-CM

## 2014-11-14 DIAGNOSIS — N390 Urinary tract infection, site not specified: Secondary | ICD-10-CM | POA: Diagnosis not present

## 2014-11-14 NOTE — Progress Notes (Signed)
Patient ID: [name], male   DOB: 1932/12/19, 81 y.o.   MRN: [id number]      [facility]     [facility] of Service: [facility] ([facility])    PCP: Oneal Grout, MD   Code Status: DNR   Allergies  Allergen Reactions  . Penicillins Shortness Of Breath    Per MAR. "Throat closes up" per daughter     Chief Complaint  Patient presents with  . Medical Management of Chronic Issues     HPI:  81 y.o. patient is seen for routine visit. He is sitting on his bed with head end propped up. He is being fed by his CNA. His breathing is comfortable with the o2. His mood has been stable. Currently on mechanical soft diet. He is under total care. He has history of ckd, dm, HTN, OSA, CHF, cva with hemiplegia, vascular dementia and afib on coumadin among others. He is currently on cranberry supplement for UTI and denies any urinary symptoms. Recent lab work reviewed. No new skin breakdown.  Review of Systems:  Constitutional: Negative for fever, chills, diaphoresis.  HENT: Negative for congestion and sore throat.   Eyes: Negative for blurred vision, double vision and discharge.  Respiratory: Negative for shortness of breath and wheezing. On chronic oxygen  Cardiovascular: Negative for chest pain, palpitations, leg swelling.  Gastrointestinal: Negative for heartburn, nausea, vomiting, abdominal pain Genitourinary: Negative for flank pain.  Musculoskeletal: Negative for falls. Chronic pain under control Skin: Negative for itching and rash.  Neurological: Negative for dizziness, headaches.  Psychiatric/Behavioral: insomnia present and trazodone helping   Past Medical History  Diagnosis Date  . Hypertension   . CHF (congestive heart failure)     H/o right sided CHF   . Arthritis   . Alzheimer's dementia   . Bipolar 1 disorder   . PVD (peripheral vascular disease)   . DVT (deep venous thrombosis)   . Atrial fibrillation   . CVA (cerebral infarction) 2004    right sided    . Dyslipidemia   . Diabetes mellitus   . Venous stasis ulcers      History reviewed. No pertinent past surgical history.   Medications:   Medication List       This list is accurate as of: 11/14/14  2:50 PM.  Always use your most recent med list.               ALPRAZolam 0.25 MG tablet  Commonly known as:  XANAX  Take 1 tablet (0.25 mg total) by mouth 2 (two) times daily as needed for anxiety.     aspirin EC 81 MG tablet  Take 81 mg by mouth daily.     carvedilol 3.125 MG tablet  Commonly known as:  COREG  Take 3.125 mg by mouth 2 (two) times daily with a meal.     Cranberry 400 MG Caps  Take 1 capsule by mouth 2 (two) times daily.     divalproex 125 MG DR tablet  Commonly known as:  DEPAKOTE  Take 375 mg by mouth at bedtime.     donepezil 10 MG tablet  Commonly known as:  ARICEPT  Take 10 mg by mouth at bedtime.     DULoxetine 60 MG capsule  Commonly known as:  CYMBALTA  Take 60 mg by mouth daily. For depression     furosemide 20 MG tablet  Commonly known as:  LASIX  Take 20 mg by mouth daily.  insulin detemir 100 UNIT/ML injection  Commonly known as:  LEVEMIR  Inject 12 Units into the skin at bedtime.     iron polysaccharides 150 MG capsule  Commonly known as:  NIFEREX  Take 150 mg by mouth 2 (two) times daily.     MIRALAX powder  Generic drug:  polyethylene glycol powder  Take 1 Container by mouth once. Mix with 4-8 ounces of liquid daily for constipation     morphine 15 MG 12 hr tablet  Commonly known as:  MS CONTIN  Take one tablet by mouth every eight hours for pain s/p CVA with left hemiplegia. Do not crush     OCUSOFT LID SCRUB Pads  Apply topically. Use as directed to each eye every morning for Chronic Blepharitis     omeprazole 40 MG capsule  Commonly known as:  PRILOSEC  Take 40 mg by mouth daily.     oxyCODONE-acetaminophen 5-325 MG per tablet  Commonly known as:  PERCOCET/ROXICET  Take one tablet by mouth three times daily  for pain; Take one tablet by mouth every 4 hours as needed for pain. Do not exceed 4gm of APAP in 24 hours     senna-docusate 8.6-50 MG per tablet  Commonly known as:  Senokot-S  Take 1 tablet by mouth 2 (two) times daily.     SYSTANE BALANCE OP  Apply 1 drop to eye 4 (four) times daily.     TRAZODONE HCL PO  Take 12.5 mg by mouth at bedtime as needed. For insomnia        Immunizations: Immunization History  Administered Date(s) Administered  . Influenza-Unspecified 11/24/2012, 11/23/2013  . PPD Test 11/20/2012, 11/02/2013  . Pneumococcal-Unspecified 02/13/2009    Physical Exam: Filed Vitals:   11/14/14 1448  BP: 127/75  Pulse: 86  Temp: 98 F (36.7 C)  Resp: 18  SpO2: 94%   Body mass index is 34.48 kg/(m^2).   General- elderly obese male in no acute distress Head- atraumatic, normocephalic Eyes- PERRLA, EOMI, no pallor, no icterus, no discharge Neck- no lymphadenopathy, no thyromegaly, no jugular vein distension Nose- no maxillary sinus tenderness, no frontal sinus tenderness Mouth- normal mucus membrane Cardiovascular- irregular heart rate, diminished dorsalis pedis pulses, trace leg edema Respiratory- bilateral poor air entry, no wheeze, no rhonchi, no crackles, on o2 by nasal canula Abdomen- bowel sounds present, soft, non tender, no guarding or rigidity Musculoskeletal- bilateral upper extremities contracture at elbows and hands, has hand braces, transferred via hoyer lift, needs assistance with feeding, ROM limited in UE and LE Neurological- alert and oriented to person Skin- warm and dry Psychiatry- normal mood, poor insight   Labs reviewed: Basic Metabolic Panel:  Recent Labs  04/54/09 08/08/14  NA 141 142  K 4.7 4.6  BUN 28* 28*  CREATININE 0.9 0.9   Liver Function Tests:  Recent Labs  04/18/14 07/11/14  AST 9* 9*  ALT 8*  --   ALKPHOS 29 31   No results for input(s): LIPASE, AMYLASE in the last 8760 hours. No results for input(s): AMMONIA  in the last 8760 hours. CBC:  Recent Labs  04/18/14 08/08/14  WBC 2.2 6.7  HGB 9.5* 9.3*  HCT 29* 28*  PLT 177 155   10/20/14 wbc 9.4, hb 9, hct 30.5, plt 160, na 142, k 4.4, bun 32, cr 0.90, glu 210  Assessment/Plan  Recurrent uti Currently asymptomatic. Continue cranberry supplement bid  DM Lab Results  Component Value Date   HGBA1C 6.5* 07/11/2014  continue levemir  12 u daily and monitor cbg. Continue aspirin. Poor leg circulation but no open sores/ wounds.   Iron deficiency anemia Reviewed h&h, continue niferex for now and monitor h&h. Continue stool softner to help prevent constipation   Goals of care: long term care   Family/ staff Communication: reviewed care plan with patient and nursing supervisor    Oneal Grout, MD  [facility] [phone] [address] [place], [place] [address] Monday-Friday 8 am - 5 pm: [phone] Afterhours: [phone]  Fax: [phone]

## 2014-11-21 ENCOUNTER — Other Ambulatory Visit: Payer: Self-pay | Admitting: *Deleted

## 2014-11-21 MED ORDER — OXYCODONE-ACETAMINOPHEN 5-325 MG PO TABS: ORAL_TABLET | ORAL | Status: AC

## 2014-11-21 NOTE — Telephone Encounter (Signed)
[facility] 

## 2014-12-07 ENCOUNTER — Other Ambulatory Visit: Payer: Self-pay | Admitting: *Deleted

## 2014-12-07 MED ORDER — MORPHINE SULFATE ER 15 MG PO TBCR: EXTENDED_RELEASE_TABLET | ORAL | Status: DC

## 2014-12-07 NOTE — Telephone Encounter (Signed)
[facility] 

## 2014-12-08 ENCOUNTER — Other Ambulatory Visit: Payer: Self-pay | Admitting: *Deleted

## 2014-12-08 MED ORDER — MORPHINE SULFATE ER 15 MG PO TBCR: EXTENDED_RELEASE_TABLET | ORAL | Status: AC

## 2014-12-08 NOTE — Telephone Encounter (Signed)
[facility] 

## 2014-12-15 ENCOUNTER — Other Ambulatory Visit: Payer: Self-pay

## 2014-12-15 MED ORDER — MORPHINE SULFATE 20 MG/5ML PO SOLN: ORAL | Status: DC

## 2014-12-15 NOTE — Telephone Encounter (Signed)
Rx faxed to [facility] @ [phone], phone number [phone]  

## 2014-12-16 ENCOUNTER — Other Ambulatory Visit: Payer: Self-pay

## 2014-12-16 MED ORDER — MORPHINE SULFATE 20 MG/5ML PO SOLN: ORAL | Status: AC

## 2014-12-16 NOTE — Telephone Encounter (Signed)
[facility] refill request 

## 2015-01-25 ENCOUNTER — Other Ambulatory Visit: Payer: Self-pay | Admitting: *Deleted

## 2015-01-25 MED ORDER — AMBULATORY NON FORMULARY MEDICATION: Status: AC

## 2015-01-25 NOTE — Telephone Encounter (Signed)
[facility] 

## 2015-01-27 ENCOUNTER — Other Ambulatory Visit: Payer: Self-pay | Admitting: *Deleted

## 2015-01-27 MED ORDER — ALPRAZOLAM 0.5 MG PO TABS: ORAL_TABLET | ORAL | Status: AC

## 2015-01-27 NOTE — Telephone Encounter (Signed)
[facility] 

## 2015-02-19 DEATH — deceased

## 2016-02-19 DEATH — deceased

## 2022-02-18 DEATH — deceased
